# Patient Record
Sex: Male | Born: 1945
Health system: Southern US, Community
[De-identification: ages and names within clinical notes are randomized; demographics above are authoritative.]

## PROBLEM LIST (undated history)

## (undated) DIAGNOSIS — M1711 Unilateral primary osteoarthritis, right knee: Secondary | ICD-10-CM

## (undated) DIAGNOSIS — U071 COVID-19: Secondary | ICD-10-CM

## (undated) DIAGNOSIS — K5790 Diverticulosis of intestine, part unspecified, without perforation or abscess without bleeding: Secondary | ICD-10-CM

## (undated) DIAGNOSIS — K4090 Unilateral inguinal hernia, without obstruction or gangrene, not specified as recurrent: Secondary | ICD-10-CM

## (undated) DIAGNOSIS — J45909 Unspecified asthma, uncomplicated: Secondary | ICD-10-CM

## (undated) HISTORY — DX: Unilateral primary osteoarthritis, right knee: M17.11

## (undated) HISTORY — PX: INGUINAL HERNIA REPAIR: SUR1180

## (undated) HISTORY — DX: Unilateral inguinal hernia, without obstruction or gangrene, not specified as recurrent: K40.90

## (undated) HISTORY — DX: Diverticulosis of intestine, part unspecified, without perforation or abscess without bleeding: K57.90

## (undated) HISTORY — DX: Unspecified asthma, uncomplicated: J45.909

## (undated) HISTORY — DX: COVID-19: U07.1

---

## 2001-08-16 ENCOUNTER — Ambulatory Visit (HOSPITAL_COMMUNITY): Admission: RE | Admit: 2001-08-16 | Discharge: 2001-08-16 | Payer: Self-pay | Admitting: Emergency Medicine

## 2002-04-22 ENCOUNTER — Inpatient Hospital Stay (HOSPITAL_COMMUNITY): Admission: EM | Admit: 2002-04-22 | Discharge: 2002-04-23 | Payer: Self-pay | Admitting: Emergency Medicine

## 2002-04-25 ENCOUNTER — Encounter: Admission: RE | Admit: 2002-04-25 | Discharge: 2002-04-25 | Payer: Self-pay | Admitting: Family Medicine

## 2005-02-02 ENCOUNTER — Ambulatory Visit: Payer: Self-pay | Admitting: Gastroenterology

## 2005-02-15 ENCOUNTER — Ambulatory Visit: Payer: Self-pay | Admitting: Gastroenterology

## 2011-04-16 NOTE — Discharge Summary (Signed)
[place]. [facility]  Patient:    Stephen Patterson, Stephen Patterson Visit Number: [id number] MRN: [id number]          Service Type: MED Location: [phone] Attending Physician:  Stephen Patterson, Stephen Patterson. Dictated by:   Stephen Patterson, M.D. Admit Date:  04/22/2002 Discharge Date: 04/23/2002   CC:         Dr. Andee Patterson [facility]   Discharge Summary  DISCHARGE DIAGNOSES 1. Left lower extremity cellulitis with lymphangitis. 2. Tinea pedis. 3. Lower extremity edema.  DISCHARGE MEDICATIONS 1. Tequin 400 mg 1 tablet p.o. q.d. x 14 day. 2. Phenergan 12.5 mg 1 tablet p.o. q.8h. p.r.n. nausea. 3. Lotrimin over-the-counter antifungal apply to toes b.i.d. x 4 weeks.  PROCEDURES PERFORMED: Lower extremity Dopplers which were negative for DVT, superficial thrombus or Bakers cyst bilaterally.  HISTORY OF PRESENT ILLNESS: Please refer to the admission history and physical for a more detailed admission summary. Briefly the patient is a 65 year old white male without any significant past medical history who [facility] with left groin pain, queasy stomach pain which then progressed to a fever, ankle swelling and redness and heat his lower extremity. He does report mowing his lawn on Saturday and wearing very old, nasty smelly tennis shoes which became progressively wet while mowing the grass. The patient has had a history of cellulitis x 4 in the past almost every year for the past four years. Unclear whether there  has been  trauma to the left lower extremity or not. He does remotely remember doing some leg lifts and possibly injuring that lower extremity. [facility] and was given 1 gm of Rocephin IM and was found to be slightly hypotensive with a white blood cell count of 18 and febrile and was sent here for concern for sepsis, ability to take p.o. antibiotics and admission for close following.  LABORATORY STUDIES ON ADMISSION: White  count 17.9, hemoglobin 15.4, hematocrit 43.7, platelets 155. Blood cultures were also obtained. Sodium 136, potassium 3.8, chloride 100, bicarbonate 28, BUN 18, creatinine 1.4, glucose 124. Transaminases within normal limits.  HOSPITAL COURSE: The patient was admitted to the [facility] for further monitoring.  1. Left lower extremity cellulitis and lymphangitis. The patient was given Tequin 400 mg IV overnight as well as Phenergan IV p.r.n. nausea. He did well overnight. He has been ambulating on the day of discharge without problem. He was afebrile with stable vital signs and tolerated a full breakfast. He will complete a full 14 day course of Tequin for the cellulitis. We also discussed possibly revisiting a vascular surgeon to see if there are any additional studies that need to be obtained. We also discussed having available close at hand empiric treatment for possible future cellulitic events. Doppler studies were obtained which did not show any evidence of DVT or Bakers cyst.  2. Tinea pedis. The patient was found to have a small erythematous ulcerated area between his fourth and fifth toe on the left. I suspect this probably the source of entry. This should be covered well by p.o. antibiotics for any pseudamonal infection as well. He is to use an over-the-counter antifungal such as Lotrimin b.i.d. for the next four weeks and avoid moist, damp areas. He to keep his feet clean and dry.  3. Lower extremity edema. The patient does have a history of lower extremity edema more pronounced on the left. He was given a prescription to  purchase 30 mmHg surgical compression stockings in a medical supply company. He will also be following up with Dr. Darrick Patterson, who will be making custom orthotics for him secondary to his significantly cavus arches.  CONDITION ON DISCHARGE: Good.  DISPOSITION: Discharged to home with family, medications as per  above.  DISCHARGE INSTRUCTIONS: Elevating his feet when sitting. Keeping his feet clean and dry. Special instructions include purchasing the surgical compression stockings to help with venous return.  FOLLOW UP: The patient will follow up with Dr. Andee Patterson in approximately one week. He will also follow up with Dr. Darrick Patterson at [facility] on May 11, 2002, at 9:30 a.m. He is to bring his shoes in with him. He voiced good understanding of the above plan and had no further questions. Dictated by:   Stephen Patterson, M.D. Attending Physician:  Stephen Patterson, Stephen Cooler D. DD:  04/23/02 TD:  04/25/02 Job: [id number]

## 2012-01-12 ENCOUNTER — Encounter: Payer: Self-pay | Admitting: Gastroenterology

## 2012-02-21 ENCOUNTER — Encounter: Payer: Self-pay | Admitting: Gastroenterology

## 2012-08-04 ENCOUNTER — Telehealth: Payer: Self-pay | Admitting: Gastroenterology

## 2012-08-07 ENCOUNTER — Telehealth: Payer: Self-pay | Admitting: Gastroenterology

## 2012-08-07 ENCOUNTER — Ambulatory Visit: Payer: Self-pay | Admitting: Nurse Practitioner

## 2012-08-07 NOTE — Telephone Encounter (Signed)
See additional phone note. 

## 2012-08-07 NOTE — Telephone Encounter (Signed)
Pt states that his bowels are not moving like normal. States this has been going on for about 3-4 days. He is only going a little at a time. Reports taking miralax, eating prunes and raisins. He states he has a history of diverticulosis but has never had diverticulitis. He states he only has minor discomfort on the left side of his abdomen, no fever and abdomen not tender to touch. Offered pt an appt today at 2pm but pt states he is out of town. Pt requests an appt Wed. Pt scheduled to see Willette Cluster NP 08/09/12@9am . Pt aware of appt date and time.

## 2012-08-07 NOTE — Telephone Encounter (Signed)
Left message for pt to call back  °

## 2012-08-09 ENCOUNTER — Encounter: Payer: Self-pay | Admitting: Nurse Practitioner

## 2012-08-09 ENCOUNTER — Ambulatory Visit (INDEPENDENT_AMBULATORY_CARE_PROVIDER_SITE_OTHER): Payer: BC Managed Care – PPO | Admitting: Nurse Practitioner

## 2012-08-09 ENCOUNTER — Other Ambulatory Visit (INDEPENDENT_AMBULATORY_CARE_PROVIDER_SITE_OTHER): Payer: BC Managed Care – PPO

## 2012-08-09 VITALS — BP 132/70 | HR 72 | Ht 75.0 in | Wt 213.4 lb

## 2012-08-09 DIAGNOSIS — R1032 Left lower quadrant pain: Secondary | ICD-10-CM

## 2012-08-09 DIAGNOSIS — K5900 Constipation, unspecified: Secondary | ICD-10-CM

## 2012-08-09 LAB — CBC WITH DIFFERENTIAL/PLATELET
Basophils Relative: 1.2 % (ref 0.0–3.0)
Eosinophils Relative: 1.7 % (ref 0.0–5.0)
Lymphocytes Relative: 15.8 % (ref 12.0–46.0)
MCV: 88.8 fl (ref 78.0–100.0)
Monocytes Absolute: 0.4 10*3/uL (ref 0.1–1.0)
Monocytes Relative: 6.2 % (ref 3.0–12.0)
Neutrophils Relative %: 75.1 % (ref 43.0–77.0)
Platelets: 177 10*3/uL (ref 150.0–400.0)
RBC: 5.13 Mil/uL (ref 4.22–5.81)
WBC: 5.7 10*3/uL (ref 4.5–10.5)

## 2012-08-09 NOTE — Progress Notes (Signed)
Reviewed, unable to demonstrate  Left inguinal hernia  While pt standing, but the symptoms a suggestive of an early left inguinal ring weakness with bowl trying to prolapse. Pt advised to observe and report any changes.

## 2012-08-09 NOTE — Progress Notes (Signed)
08/09/2012 Stephen Patterson [id number] 17-Aug-1946   HISTORY OF PRESENT ILLNESS: Patient is a 66 year old male with no signifiant PMH.  He gets screening colonoscopies with Dr Arlyce Dice. Last colonoscopy March 2006 .diverticulosis was found, no other findings. Do to: Cancer screening guidelines, surveillance colonoscopy was moved to 2016. Patient here for evaluation of new onset constipation. Bowel movements have always been "regular" but 2 months ago patient had an episode of constipation which began with a decreased urge to defecate followed by a small amount of stool. Patient took   miralax and constipation resolved. He had a recurrent episode of constipation a few days ago, this time associated with mild bloating and mild LLQ discomfort. In addition, he describes a small old in left lower quadrant. He restarted MiraLax and bowel movements are normal again and the LLQ bulge is gone though he does still have some very mild left lower quadrant discomfort. On a rare occasion he sees a very scant amount of blood when wiping.   Past Medical History  Diagnosis Date  . Diverticulosis   . Asthma    History reviewed. No pertinent past surgical history.  reports that he has never smoked. He has never used smokeless tobacco. He reports that he drinks alcohol. He reports that he does not use illicit drugs. family history includes Colon cancer (age of onset:56) in an unspecified family member. Allergies  Allergen Reactions  . Penicillins     As a child unsure of reaction      Outpatient Encounter Prescriptions as of 08/09/2012  Medication Sig Dispense Refill  . albuterol (PROVENTIL HFA;VENTOLIN HFA) 108 (90 BASE) MCG/ACT inhaler Inhale 2 puffs into the lungs as needed.          REVIEW OF SYSTEMS  : All other systems reviewed and negative except where noted in the History of Present Illness.   PHYSICAL EXAM: BP 132/70  Pulse 72  Ht 6\' 3"  (1.905 m)  Wt 213 lb 6.4 oz (96.798 kg)  BMI 26.67  kg/m2 General: Well developed white male in no acute distress Head: Normocephalic and atraumatic Eyes:  sclerae anicteric,conjunctive pink. Ears: Normal auditory acuity Neck: Supple, no masses.  Lungs: Clear throughout to auscultation Heart: Regular rate and rhythm Abdomen: Soft, non distended, nontender. No masses or hepatomegaly noted. Normal Bowel sounds. No appreciable LLQ or left inguinal "bulge". No left inguinal hernia felt by Dr. Juanda Chance today. Musculoskeletal: Symmetrical with no gross deformities  Skin: No lesions on visible extremities Extremities: No edema or deformities noted Neurological: Alert oriented x 4, grossly nonfocal Cervical Nodes:  No significant cervical adenopathy Psychological:  Alert and cooperative. Normal mood and affect  ASSESSMENT AND PLAN:  1. constipation with mild bloating and mild LLQ discomfort. Unclear why the recent episodes of constipation. His mild bloating and LLQ discomfort could be from constipation. With MiraLax his bowels are moving well but he still has some mild left lower quadrant discomfort. Doubt diverticulitis but will check CBC today. We discussed options. Since his bowel movements are back to normal we agreed to give this a a few more days. If left lower quadrant discomfort persist despite normal bowel movements then will treat empirically for what could be very mild diverticulitis. If he has recurrent constipation, will obtain some basic labs and possibly consider doing his colonoscopy earlier given bowel habit changes. Our office will call patient for condition update and discussion of CBC results in a few days.  2. Questionable early left inguinal hernia. No hernia on  exam today(by Dr. Juanda Chance) but patient describes a small left inguinal bulge when he was constipated a few days ago. Perhaps he has the beginning of a left inguinal hernia.

## 2012-08-09 NOTE — Patient Instructions (Addendum)
Please go to the basement level to have your labs drawn.  Call us in a few days with a progress report.

## 2012-10-02 ENCOUNTER — Telehealth: Payer: Self-pay | Admitting: Gastroenterology

## 2012-10-02 NOTE — Telephone Encounter (Signed)
Pt states he is still having problems with constipation. Also states he thinks he has an inguinal hernia. States "something popped out" Saturday when he was playing golf and he was able to push it back in. Let pt know it is ok for him to take miralax daily if he needs to for the constipation but for the hernia he needs to see a surgeon. Pt given the phone number for [facility]. Pt wants to keep the appt with Dr. Arlyce Dice for constipation.

## 2012-10-04 ENCOUNTER — Ambulatory Visit (INDEPENDENT_AMBULATORY_CARE_PROVIDER_SITE_OTHER): Payer: BC Managed Care – PPO | Admitting: General Surgery

## 2012-10-06 ENCOUNTER — Ambulatory Visit (INDEPENDENT_AMBULATORY_CARE_PROVIDER_SITE_OTHER): Payer: BC Managed Care – PPO | Admitting: General Surgery

## 2012-10-06 ENCOUNTER — Encounter (INDEPENDENT_AMBULATORY_CARE_PROVIDER_SITE_OTHER): Payer: Self-pay | Admitting: General Surgery

## 2012-10-06 VITALS — BP 130/76 | HR 74 | Temp 97.8°F | Resp 16 | Ht 76.0 in | Wt 215.0 lb

## 2012-10-06 DIAGNOSIS — K4090 Unilateral inguinal hernia, without obstruction or gangrene, not specified as recurrent: Secondary | ICD-10-CM

## 2012-10-06 NOTE — Progress Notes (Signed)
Patient ID: Stephen Patterson, male   DOB: 05/26/1946, 66 y.o.   MRN: [id number]  Chief Complaint  Patient presents with  . Inguinal Hernia    HPI Stephen Patterson is a 66 y.o. male.  His self-referred for a left inguinal hernia. Patient began having noticeable pain burning sensations approximately 2 months ago. He noticed while playing golf of the hernia would bulge out and he would have to reduce it.  The patient has had no signs of incarceration, but has had some constipation for which takes MiraLax at this time. HPI  Past Medical History  Diagnosis Date  . Diverticulosis   . Asthma   . Inguinal hernia     History reviewed. No pertinent past surgical history.  Family History  Problem Relation Age of Onset  . Colon cancer  45    niece  . Cancer Mother     breast    Social History History  Substance Use Topics  . Smoking status: Never Smoker   . Smokeless tobacco: Never Used  . Alcohol Use: Yes     Comment: 2 per week    Allergies  Allergen Reactions  . Penicillins     As a child unsure of reaction    Current Outpatient Prescriptions  Medication Sig Dispense Refill  . albuterol (PROVENTIL HFA;VENTOLIN HFA) 108 (90 BASE) MCG/ACT inhaler Inhale 2 puffs into the lungs as needed.         Review of Systems Review of Systems  Constitutional: Negative.   HENT: Negative.   Eyes: Negative.   Respiratory: Negative.   Cardiovascular: Negative.   Gastrointestinal: Negative.   Musculoskeletal: Negative.     Blood pressure 130/76, pulse 74, temperature 97.8 F (36.6 C), temperature source Temporal, resp. rate 16, height 6\' 4"  (1.93 m), weight 215 lb (97.523 kg).  Physical Exam Physical Exam  Constitutional: He is oriented to person, place, and time. He appears well-developed and well-nourished.  HENT:  Head: Normocephalic and atraumatic.  Eyes: Pupils are equal, round, and reactive to light.  Neck: Normal range of motion. Neck supple.  Cardiovascular: Normal rate and  regular rhythm.   Pulmonary/Chest: Effort normal and breath sounds normal.  Abdominal: Soft. Bowel sounds are normal. A hernia is present. Hernia confirmed positive in the left inguinal area. Hernia confirmed negative in the right inguinal area.       Less likely direct inguinal hernia  Musculoskeletal: Normal range of motion.  Neurological: He is alert and oriented to person, place, and time.    Data Reviewed none  Assessment    66 year old male with a left inguinal hernia, likely direct.    Plan    1. We'll proceed to the operating room for left laparoscopic inguinal hernia repair Mesh.  2. All risks and benefits were discussed with the patient, to generally include infection, bleeding, damage to surrounding structures, and recurrence. Alternatives were offered and described.  All questions were answered and the patient voiced understanding of the procedure and wishes to proceed at this point.        Marigene Ehlers., Kayci Belleville 10/06/2012, 2:07 PM

## 2012-10-12 ENCOUNTER — Ambulatory Visit: Payer: BC Managed Care – PPO | Admitting: Gastroenterology

## 2012-10-13 ENCOUNTER — Ambulatory Visit (INDEPENDENT_AMBULATORY_CARE_PROVIDER_SITE_OTHER): Payer: BC Managed Care – PPO | Admitting: General Surgery

## 2012-10-24 ENCOUNTER — Encounter (INDEPENDENT_AMBULATORY_CARE_PROVIDER_SITE_OTHER): Payer: BC Managed Care – PPO | Admitting: General Surgery

## 2012-12-04 ENCOUNTER — Telehealth: Payer: Self-pay | Admitting: Gastroenterology

## 2012-12-04 NOTE — Telephone Encounter (Signed)
Pt states he is having problems with LLQ abdominal pain. Reports he also had some "ribbon like" stool this morning. Pt concerned. Pt scheduled to see Dr. Arlyce Dice 12/07/12@9 :30am. Pt aware of appt date and time.

## 2012-12-07 ENCOUNTER — Ambulatory Visit: Payer: BC Managed Care – PPO | Admitting: Gastroenterology

## 2012-12-20 ENCOUNTER — Encounter: Payer: Self-pay | Admitting: Gastroenterology

## 2012-12-20 ENCOUNTER — Ambulatory Visit (INDEPENDENT_AMBULATORY_CARE_PROVIDER_SITE_OTHER): Payer: BC Managed Care – PPO | Admitting: Gastroenterology

## 2012-12-20 VITALS — BP 120/78 | HR 76 | Ht 76.0 in | Wt 216.0 lb

## 2012-12-20 DIAGNOSIS — R1032 Left lower quadrant pain: Secondary | ICD-10-CM

## 2012-12-20 DIAGNOSIS — K5900 Constipation, unspecified: Secondary | ICD-10-CM

## 2012-12-20 NOTE — Patient Instructions (Addendum)
You have been scheduled for a colonoscopy with propofol. Please follow written instructions given to you at your visit today.  Please pick up your prep kit at the pharmacy within the next 1-3 days. If you use inhalers (even only as needed) or a CPAP machine, please bring them with you on the day of your procedure.  Suprep has been sent to your pharmacy

## 2012-12-20 NOTE — Assessment & Plan Note (Signed)
Symptomatic left inguinal hernia. Plans per surgery

## 2012-12-20 NOTE — Assessment & Plan Note (Signed)
Patient has had a mild change in bowel habits. Last colonoscopy was 8 years ago. I think is reasonable to do a colonoscopy at this time, particularly since he is anticipating a herniorrhaphy.

## 2012-12-20 NOTE — Progress Notes (Signed)
History of Present Illness:  The patient has returned for followup of constipation and hernia. Constipation has responded fairly well to MiraLax. He remains concerned about colon cancer because his 68 year old niece recently was diagnosed with colon cancer. Last colonoscopy was 2006. He has a left inguinal hernia and is symptomatic from this. He anticipates getting this repaired. On occasion he's had minimal amounts of bleeding when he moves his bowels.    Review of Systems: Pertinent positive and negative review of systems were noted in the above HPI section. All other review of systems were otherwise negative.    Current Medications, Allergies, Past Medical History, Past Surgical History, Family History and Social History were reviewed in [employer] electronic medical record  Vital signs were reviewed in today's medical record. Physical Exam: General: Well developed , well nourished, no acute distress

## 2013-01-09 ENCOUNTER — Ambulatory Visit (AMBULATORY_SURGERY_CENTER): Payer: BC Managed Care – PPO | Admitting: Gastroenterology

## 2013-01-09 ENCOUNTER — Encounter: Payer: Self-pay | Admitting: Gastroenterology

## 2013-01-09 VITALS — BP 151/93 | HR 58 | Temp 97.0°F | Resp 29 | Ht 76.0 in | Wt 216.0 lb

## 2013-01-09 DIAGNOSIS — D126 Benign neoplasm of colon, unspecified: Secondary | ICD-10-CM

## 2013-01-09 DIAGNOSIS — Z1211 Encounter for screening for malignant neoplasm of colon: Secondary | ICD-10-CM

## 2013-01-09 DIAGNOSIS — K5900 Constipation, unspecified: Secondary | ICD-10-CM

## 2013-01-09 DIAGNOSIS — R1032 Left lower quadrant pain: Secondary | ICD-10-CM

## 2013-01-09 MED ORDER — SODIUM CHLORIDE 0.9 % IV SOLN: 500.0000 mL | INTRAVENOUS | Status: DC

## 2013-01-09 NOTE — Op Note (Addendum)
[facility] [address] [place], [address]   COLONOSCOPY PROCEDURE REPORT  PATIENT: Stephen Patterson, Stephen Patterson  MR#: [id number] BIRTHDATE: Dec 22, 1945 , 66  yrs. old GENDER: Male ENDOSCOPIST: Louis Meckel, MD REFERRED AO:[id number] Vear Clock, M.D. PROCEDURE DATE:  01/09/2013 PROCEDURE:   Colonoscopy with cold biopsy polypectomy ASA CLASS:   Class II INDICATIONS: Colorectal cancer screening. MEDICATIONS: MAC sedation, administered by CRNA and propofol (Diprivan) 250mg  IV  DESCRIPTION OF PROCEDURE:   After the risks benefits and alternatives of the procedure were thoroughly explained, informed consent was obtained.  A digital rectal exam revealed no abnormalities of the rectum.   The LB [id number]  endoscope was introduced through the anus and advanced to the cecum, which was identified by both the appendix and ileocecal valve. No adverse events experienced.   The quality of the prep was Suprep excellent The instrument was then slowly withdrawn as the colon was fully examined.      COLON FINDINGS: A sessile polyp measuring 1-2 mm in size was found at the cecum.  A polypectomy was performed with cold forceps.  The resection was complete and the polyp tissue was completely retrieved.   The colon mucosa was otherwise normal.  Retroflexed views revealed no abnormalities. The time to cecum=3 minutes 18 seconds.  Withdrawal time=6 minutes 05 seconds.  The scope was withdrawn and the procedure completed. COMPLICATIONS: There were no complications.  ENDOSCOPIC IMPRESSION: 1.   Sessile polyp measuring 1-2 mm in size was found at the cecum; polypectomy was performed with cold forceps 2.   The colon mucosa was otherwise normal  RECOMMENDATIONS: If the polyp(s) removed today are proven to be adenomatous (pre-cancerous) polyps, you will need a repeat colonoscopy in 5 years.  Otherwise you should continue to follow colorectal cancer screening guidelines for "routine  risk" patients with colonoscopy in 10 years.  You will receive a letter within 1-2 weeks with the results of your biopsy as well as final recommendations.  Please call my office if you have not received a letter after 3 weeks. Fiber supplementation for constipation   eSigned:  Louis Meckel, MD 08/20/2013 11:23 AM Revised: 08/20/2013 11:23 AM  cc:

## 2013-01-09 NOTE — Progress Notes (Signed)
Patient did not experience any of the following events: a burn prior to discharge; a fall within the facility; wrong site/side/patient/procedure/implant event; or a hospital transfer or hospital admission upon discharge from the facility. (G8907) Patient did not have preoperative order for IV antibiotic SSI prophylaxis. (G8918)  

## 2013-01-09 NOTE — Progress Notes (Signed)
Called to room to assist during endoscopic procedure.  Patient ID and intended procedure confirmed with present staff. Received instructions for my participation in the procedure from the performing physician.  

## 2013-01-09 NOTE — Patient Instructions (Addendum)
Discharge instructions given with verbal understanding. Handout on polyps given. Resume previous medications. YOU HAD AN ENDOSCOPIC PROCEDURE TODAY AT THE [facility]: Refer to the procedure report that was given to you for any specific questions about what was found during the examination.  If the procedure report does not answer your questions, please call your gastroenterologist to clarify.  If you requested that your care partner not be given the details of your procedure findings, then the procedure report has been included in a sealed envelope for you to review at your convenience later.  YOU SHOULD EXPECT: Some feelings of bloating in the abdomen. Passage of more gas than usual.  Walking can help get rid of the air that was put into your GI tract during the procedure and reduce the bloating. If you had a lower endoscopy (such as a colonoscopy or flexible sigmoidoscopy) you may notice spotting of blood in your stool or on the toilet paper. If you underwent a bowel prep for your procedure, then you may not have a normal bowel movement for a few days.  DIET: Your first meal following the procedure should be a light meal and then it is ok to progress to your normal diet.  A half-sandwich or bowl of soup is an example of a good first meal.  Heavy or fried foods are harder to digest and may make you feel nauseous or bloated.  Likewise meals heavy in dairy and vegetables can cause extra gas to form and this can also increase the bloating.  Drink plenty of fluids but you should avoid alcoholic beverages for 24 hours.  ACTIVITY: Your care partner should take you home directly after the procedure.  You should plan to take it easy, moving slowly for the rest of the day.  You can resume normal activity the day after the procedure however you should NOT DRIVE or use heavy machinery for 24 hours (because of the sedation medicines used during the test).    SYMPTOMS TO REPORT IMMEDIATELY: A  gastroenterologist can be reached at any hour.  During normal business hours, 8:30 AM to 5:00 PM Monday through Friday, call [phone].  After hours and on weekends, please call the GI answering service at [phone] who will take a message and have the physician on call contact you.   Following lower endoscopy (colonoscopy or flexible sigmoidoscopy):  Excessive amounts of blood in the stool  Significant tenderness or worsening of abdominal pains  Swelling of the abdomen that is new, acute  Fever of 100F or higher  FOLLOW UP: If any biopsies were taken you will be contacted by phone or by letter within the next 1-3 weeks.  Call your gastroenterologist if you have not heard about the biopsies in 3 weeks.  Our staff will call the home number listed on your records the next business day following your procedure to check on you and address any questions or concerns that you may have at that time regarding the information given to you following your procedure. This is a courtesy call and so if there is no answer at the home number and we have not heard from you through the emergency physician on call, we will assume that you have returned to your regular daily activities without incident.  SIGNATURES/CONFIDENTIALITY: You and/or your care partner have signed paperwork which will be entered into your electronic medical record.  These signatures attest to the fact that that the information above on your After Visit Summary has   been reviewed and is understood.  Full responsibility of the confidentiality of this discharge information lies with you and/or your care-partner. 

## 2013-01-10 ENCOUNTER — Telehealth: Payer: Self-pay | Admitting: *Deleted

## 2013-01-10 NOTE — Telephone Encounter (Signed)
  Follow up Call-  Call back number 01/09/2013  Post procedure Call Back phone  # [phone]  Permission to leave phone message Yes     Patient questions:  Do you have a fever, pain , or abdominal swelling? no Pain Score  0 *  Have you tolerated food without any problems? yes  Have you been able to return to your normal activities? yes  Do you have any questions about your discharge instructions: Diet   no Medications  no Follow up visit  no  Do you have questions or concerns about your Care? no  Actions: * If pain score is 4 or above: No action needed, pain <4.

## 2013-01-17 ENCOUNTER — Encounter: Payer: Self-pay | Admitting: Gastroenterology

## 2013-12-11 ENCOUNTER — Ambulatory Visit (INDEPENDENT_AMBULATORY_CARE_PROVIDER_SITE_OTHER): Payer: BC Managed Care – PPO | Admitting: General Surgery

## 2013-12-11 ENCOUNTER — Encounter (INDEPENDENT_AMBULATORY_CARE_PROVIDER_SITE_OTHER): Payer: Self-pay | Admitting: General Surgery

## 2013-12-11 VITALS — BP 142/82 | HR 72 | Resp 16 | Ht 76.0 in | Wt 212.6 lb

## 2013-12-11 DIAGNOSIS — K4090 Unilateral inguinal hernia, without obstruction or gangrene, not specified as recurrent: Secondary | ICD-10-CM

## 2013-12-11 NOTE — Progress Notes (Signed)
Patient ID: Stephen Patterson, male   DOB: 1946-01-13, 68 y.o.   MRN: [id number] Patient presents with   .  Inguinal Hernia   HPI  Stephen Patterson is a 68 y.o. male. His self-referred for a left inguinal hernia. Patient began having noticeable pain burning sensations approximately several months ago. He noticed while playing golf of the hernia would bulge out and he would have to reduce it. The patient has had no signs of incarceration, but has had some constipation for which takes MiraLax at this time.  HPI  Past Medical History   Diagnosis  Date   .  Diverticulosis    .  Asthma    .  Inguinal hernia    History reviewed. No pertinent past surgical history.  Family History   Problem  Relation  Age of Onset   .  Colon cancer   105      niece    .  Cancer  Mother       breast   Social History  History   Substance Use Topics   .  Smoking status:  Never Smoker   .  Smokeless tobacco:  Never Used   .  Alcohol Use:  Yes      Comment: 2 per week    Allergies   Allergen  Reactions   .  Penicillins      As a child unsure of reaction    Current Outpatient Prescriptions   Medication  Sig  Dispense  Refill   .  albuterol (PROVENTIL HFA;VENTOLIN HFA) 108 (90 BASE) MCG/ACT inhaler  Inhale 2 puffs into the lungs as needed.     Review of Systems  Review of Systems  Constitutional: Negative.  HENT: Negative.  Eyes: Negative.  Respiratory: Negative.  Cardiovascular: Negative.  Gastrointestinal: Negative.  Musculoskeletal: Negative.  Blood pressure 130/76, pulse 74, temperature 97.8 F (36.6 C), temperature source Temporal, resp. rate 16, height 6\' 4"  (1.93 m), weight 215 lb (97.523 kg).  Physical Exam  Physical Exam  Constitutional: He is oriented to person, place, and time. He appears well-developed and well-nourished.  HENT:  Head: Normocephalic and atraumatic.  Eyes: Pupils are equal, round, and reactive to light.  Neck: Normal range of motion. Neck supple.  Cardiovascular: Normal  rate and regular rhythm.  Pulmonary/Chest: Effort normal and breath sounds normal.  Abdominal: Soft. Bowel sounds are normal. A hernia is present. Hernia confirmed positive in the left inguinal area. Hernia confirmed negative in the right inguinal area.  Less likely direct inguinal hernia  Musculoskeletal: Normal range of motion.  Neurological: He is alert and oriented to person, place, and time.  Data Reviewed  none  Assessment  68 year old male with a left inguinal hernia, likely direct.  Plan  1. We'll proceed to the operating room for left laparoscopic inguinal hernia repair Mesh.  2. All risks and benefits were discussed with the patient, to generally include infection, bleeding, damage to surrounding structures, and recurrence. Alternatives were offered and described. All questions were answered and the patient voiced understanding of the procedure and wishes to proceed at this point.

## 2013-12-14 ENCOUNTER — Ambulatory Visit (INDEPENDENT_AMBULATORY_CARE_PROVIDER_SITE_OTHER): Payer: BC Managed Care – PPO | Admitting: General Surgery

## 2013-12-18 DIAGNOSIS — K4090 Unilateral inguinal hernia, without obstruction or gangrene, not specified as recurrent: Secondary | ICD-10-CM

## 2014-01-03 ENCOUNTER — Ambulatory Visit (INDEPENDENT_AMBULATORY_CARE_PROVIDER_SITE_OTHER): Payer: BC Managed Care – PPO | Admitting: General Surgery

## 2014-01-03 ENCOUNTER — Encounter (INDEPENDENT_AMBULATORY_CARE_PROVIDER_SITE_OTHER): Payer: Self-pay | Admitting: General Surgery

## 2014-01-03 VITALS — BP 138/82 | HR 74 | Resp 16 | Ht 76.0 in | Wt 209.0 lb

## 2014-01-03 DIAGNOSIS — Z9889 Other specified postprocedural states: Secondary | ICD-10-CM

## 2014-01-03 NOTE — Progress Notes (Signed)
Patient ID: Stephen Patterson, male   DOB: [birth year], 68 y.o.   MRN: [id number] Post op course The patient has been doing well postoperatively. He's had minimal pain taking no pain medication. The patient is slowly getting back to the gym and on the treadmill. He is not do any heavy lifting.  On Exam: His wounds are clean dry and intact, there is no hernia on palpation   Assessment and Plan 68 year old male status post laparoscopic left inguinal hernia repair with mesh 1. We discussed weightlifting restrictions for another 2 weeks. 2. Patient follow up as needed   Ralene Ok, MD [facility] General & Minimally Invasive Surgery Trauma & Emergency Surgery

## 2014-03-26 ENCOUNTER — Ambulatory Visit: Payer: BC Managed Care – PPO | Admitting: Family Medicine

## 2014-03-26 VITALS — BP 138/82 | HR 62 | Temp 97.5°F | Resp 16 | Ht 62.5 in | Wt 209.6 lb

## 2014-03-26 DIAGNOSIS — H1013 Acute atopic conjunctivitis, bilateral: Secondary | ICD-10-CM

## 2014-03-26 DIAGNOSIS — R590 Localized enlarged lymph nodes: Secondary | ICD-10-CM

## 2014-03-26 DIAGNOSIS — H1045 Other chronic allergic conjunctivitis: Secondary | ICD-10-CM

## 2014-03-26 DIAGNOSIS — R599 Enlarged lymph nodes, unspecified: Secondary | ICD-10-CM

## 2014-03-26 DIAGNOSIS — J069 Acute upper respiratory infection, unspecified: Secondary | ICD-10-CM

## 2014-03-26 MED ORDER — CETIRIZINE HCL 10 MG PO TABS: 10.0000 mg | ORAL_TABLET | Freq: Every day | ORAL | Status: DC

## 2014-03-26 MED ORDER — ALBUTEROL SULFATE HFA 108 (90 BASE) MCG/ACT IN AERS: 2.0000 | INHALATION_SPRAY | RESPIRATORY_TRACT | Status: DC | PRN

## 2014-03-26 MED ORDER — CEFDINIR 300 MG PO CAPS: 600.0000 mg | ORAL_CAPSULE | Freq: Every day | ORAL | Status: DC

## 2014-03-26 NOTE — Progress Notes (Signed)
This chart was scribed for Laurey Arrow. Brigitte Pulse, MD by Marcha Dutton, ED Scribe. This patient was seen in room 10 and the patient's care was started at 10:54 AM.  Subjective:    Patient ID: Stephen Patterson, male    DOB: 1946-07-21, 68 y.o.   MRN: [id number]  Chief Complaint  Patient presents with  . URI    little wheezing;Cough-yellow;chest congestion;eyes-red, matted x 1 wk    HPI HPI Comments: Stephen Patterson is a 68 y.o. male who presents to the [facility] complaining of congestion that began a week or so ago. He also reports eye discharge, cough productive of green and yellow sputum that has woken him from sleep, and swollen and tender right ant upper cervical lymph. He also reports he has some shortness of breath due to mild asthma for which he uses an albuterol inhaler prn - uses rarely but more over past few d. Pt states his wife was seen here by Dr. Everlene Farrier for similar for URI symptoms 10d prior. He reports she ended up having pneumonia seen on CXR and CT, for which she was prescribed omnicef. His wife does smoke but outside only. He's concerned he may have caught her pneumonia. He states that sev days after she started the antibiotic, he developed laryngitis and pharyngitis which then evolved into his current symptoms. Pt reports using Mucinex with mild relief of symptoms. He denies sinus pressure and pain, upper back pain, chest congestion, fever, and chills.   Pt reports penicillin allergy was diagnosed as a child and states he is unsure if allergy persists.    Past Medical History  Diagnosis Date  . Diverticulosis   . Asthma   . Inguinal hernia     Current Outpatient Prescriptions on File Prior to Visit  Medication Sig Dispense Refill  . albuterol (PROVENTIL HFA;VENTOLIN HFA) 108 (90 BASE) MCG/ACT inhaler Inhale 2 puffs into the lungs as needed.        No current facility-administered medications on file prior to visit.    Allergies  Allergen Reactions  .  Penicillins     As a child unsure of reaction    Review of Systems  Constitutional: Negative for fever, chills, diaphoresis, activity change and appetite change.  HENT: Positive for congestion and postnasal drip. Negative for ear discharge, ear pain, hearing loss, nosebleeds, rhinorrhea, sinus pressure and sore throat.   Eyes: Positive for discharge and redness. Negative for photophobia, pain, itching and visual disturbance.  Respiratory: Positive for cough and shortness of breath. Negative for apnea, chest tightness and wheezing.   Gastrointestinal: Negative for nausea, vomiting, abdominal pain, diarrhea and abdominal distention.  Genitourinary: Negative for dysuria and frequency.  Musculoskeletal: Positive for back pain and myalgias. Negative for arthralgias, joint swelling, neck pain and neck stiffness.  Skin: Negative for rash.  Allergic/Immunologic: Positive for environmental allergies.  Neurological: Negative for dizziness and light-headedness.  Hematological: Positive for adenopathy.  Psychiatric/Behavioral: The patient is not nervous/anxious.        Objective:   Physical Exam  Nursing note and vitals reviewed. Constitutional: He is oriented to person, place, and time. He appears well-developed and well-nourished. No distress.  HENT:  Head: Normocephalic and atraumatic.  Right Ear: Tympanic membrane is injected.  Left Ear: Tympanic membrane is injected.  Nose: Mucosal edema present.  Neck: Normal range of motion. Neck supple. No thyromegaly present.  Cardiovascular: Normal rate, regular rhythm and normal heart sounds.  Exam reveals no gallop and  no friction rub.   No murmur heard. Pulmonary/Chest: Effort normal and breath sounds normal. No respiratory distress. He has no wheezes. He has no rales.  Lymphadenopathy:    He has cervical adenopathy (mild anterior ).  Neurological: He is alert and oriented to person, place, and time. No cranial nerve deficit. Coordination normal.    Skin: Skin is warm and dry. He is not diaphoretic.  Psychiatric: He has a normal mood and affect. His behavior is normal.    Triage Vitals: BP 138/82  Pulse 62  Temp(Src) 97.5 F (36.4 C) (Oral)  Resp 16  Ht 5' 2.5" (1.588 m)  Wt 209 lb 9.6 oz (95.074 kg)  BMI 37.70 kg/m2  SpO2 95%     Assessment & Plan:  DIAGNOSTIC STUDIES: Oxygen Saturation is 95% on RA, adequate by my interpretation.    COORDINATION OF CARE: Pt advised of plan for treatment and pt agrees. Discussed antibiotic use in light of wife's bacterial pneumonia diagnosis and pt's deep productive cough and LAD w/ h/o asthma.  If no improvement or worsening, RTC for CXR and cbc. Continue usage of mucinex to treat symptoms. If eye symptoms worsen, would recommend antihistamine drops such as Pataday. Alb refilled for int mild asthma. R/B of abx therapy reviewed. URI, acute  Allergic conjunctivitis of both eyes  Lymphadenopathy of right cervical region  Meds ordered this encounter  Medications  . cefdinir (OMNICEF) 300 MG capsule    Sig: Take 2 capsules (600 mg total) by mouth daily.    Dispense:  20 capsule    Refill:  0  . cetirizine (ZYRTEC) 10 MG tablet    Sig: Take 1 tablet (10 mg total) by mouth daily.    Dispense:  30 tablet    Refill:  1  . albuterol (PROVENTIL HFA;VENTOLIN HFA) 108 (90 BASE) MCG/ACT inhaler    Sig: Inhale 2 puffs into the lungs every 4 (four) hours as needed for wheezing or shortness of breath.    Dispense:  3.7 g    Refill:  3    I personally performed the services described in this documentation, which was scribed in my presence. The recorded information has been reviewed and considered, and addended by me as needed.  Delman Cheadle, MD MPH

## 2014-03-26 NOTE — Patient Instructions (Signed)
Hay Fever Hay fever is an allergic reaction to particles in the air. It cannot be passed from person to person. It cannot be cured, but it can be controlled. CAUSES  Hay fever is caused by something that triggers an allergic reaction (allergens). The following are examples of allergens:  Ragweed.  Feathers.  Animal dander.  Grass and tree pollens.  Cigarette smoke.  House dust.  Pollution. SYMPTOMS   Sneezing.  Runny or stuffy nose.  Tearing eyes.  Itchy eyes, nose, mouth, throat, skin, or other area.  Sore throat.  Headache.  Decreased sense of smell or taste. DIAGNOSIS Your caregiver will perform a physical exam and ask questions about the symptoms you are having.Allergy testing may be done to determine exactly what triggers your hay fever.  TREATMENT   Over-the-counter medicines may help symptoms. These include:  Antihistamines.  Decongestants. These may help with nasal congestion.  Your caregiver may prescribe medicines if over-the-counter medicines do not work.  Some people benefit from allergy shots when other medicines are not helpful. HOME CARE INSTRUCTIONS   Avoid the allergen that is causing your symptoms, if possible.  Take all medicine as told by your caregiver. SEEK MEDICAL CARE IF:   You have severe allergy symptoms and your current medicines are not helping.  Your treatment was working at one time, but you are now experiencing symptoms.  You have sinus congestion and pressure.  You develop a fever or headache.  You have thick nasal discharge.  You have asthma and have a worsening cough and wheezing. SEEK IMMEDIATE MEDICAL CARE IF:   You have swelling of your tongue or lips.  You have trouble breathing.  You feel lightheaded or like you are going to faint.  You have cold sweats.  You have a fever. Document Released: 11/15/2005 Document Revised: 02/07/2012 Document Reviewed: 02/10/2011 [facility] Patient Information 2014  [place]. Pneumonia, Adult Pneumonia is an infection of the lungs.  CAUSES Pneumonia may be caused by bacteria or a virus. Usually, these infections are caused by breathing infectious particles into the lungs (respiratory tract). SYMPTOMS   Cough.  Fever.  Chest pain.  Increased rate of breathing.  Wheezing.  Mucus production. DIAGNOSIS  If you have the common symptoms of pneumonia, your caregiver will typically confirm the diagnosis with a chest X-ray. The X-ray will show an abnormality in the lung (pulmonary infiltrate) if you have pneumonia. Other tests of your blood, urine, or sputum may be done to find the specific cause of your pneumonia. Your caregiver may also do tests (blood gases or pulse oximetry) to see how well your lungs are working. TREATMENT  Some forms of pneumonia may be spread to other people when you cough or sneeze. You may be asked to wear a mask before and during your exam. Pneumonia that is caused by bacteria is treated with antibiotic medicine. Pneumonia that is caused by the influenza virus may be treated with an antiviral medicine. Most other viral infections must run their course. These infections will not respond to antibiotics.  PREVENTION A pneumococcal shot (vaccine) is available to prevent a common bacterial cause of pneumonia. This is usually suggested for:  People over 11 years old.  Patients on chemotherapy.  People with chronic lung problems, such as bronchitis or emphysema.  People with immune system problems. If you are over 65 or have a high risk condition, you may receive the pneumococcal vaccine if you have not received it before. In some countries, a routine influenza vaccine  is also recommended. This vaccine can help prevent some cases of pneumonia.You may be offered the influenza vaccine as part of your care. If you smoke, it is time to quit. You may receive instructions on how to stop smoking. Your caregiver can provide medicines  and counseling to help you quit. HOME CARE INSTRUCTIONS   Cough suppressants may be used if you are losing too much rest. However, coughing protects you by clearing your lungs. You should avoid using cough suppressants if you can.  Your caregiver may have prescribed medicine if he or she thinks your pneumonia is caused by a bacteria or influenza. Finish your medicine even if you start to feel better.  Your caregiver may also prescribe an expectorant. This loosens the mucus to be coughed up.  Only take over-the-counter or prescription medicines for pain, discomfort, or fever as directed by your caregiver.  Do not smoke. Smoking is a common cause of bronchitis and can contribute to pneumonia. If you are a smoker and continue to smoke, your cough may last several weeks after your pneumonia has cleared.  A cold steam vaporizer or humidifier in your room or home may help loosen mucus.  Coughing is often worse at night. Sleeping in a semi-upright position in a recliner or using a couple pillows under your head will help with this.  Get rest as you feel it is needed. Your body will usually let you know when you need to rest. SEEK IMMEDIATE MEDICAL CARE IF:   Your illness becomes worse. This is especially true if you are elderly or weakened from any other disease.  You cannot control your cough with suppressants and are losing sleep.  You begin coughing up blood.  You develop pain which is getting worse or is uncontrolled with medicines.  You have a fever.  Any of the symptoms which initially brought you in for treatment are getting worse rather than better.  You develop shortness of breath or chest pain. MAKE SURE YOU:   Understand these instructions.  Will watch your condition.  Will get help right away if you are not doing well or get worse. Document Released: 11/15/2005 Document Revised: 02/07/2012 Document Reviewed: 02/04/2011 [facility] Patient Information 2014 [place],  [place].

## 2014-05-28 ENCOUNTER — Other Ambulatory Visit: Payer: Self-pay

## 2014-07-08 ENCOUNTER — Other Ambulatory Visit: Payer: Self-pay | Admitting: Family Medicine

## 2014-09-16 ENCOUNTER — Other Ambulatory Visit: Payer: Self-pay | Admitting: Family Medicine

## 2015-04-04 ENCOUNTER — Other Ambulatory Visit: Payer: Self-pay | Admitting: Family Medicine

## 2015-05-08 ENCOUNTER — Ambulatory Visit (INDEPENDENT_AMBULATORY_CARE_PROVIDER_SITE_OTHER): Payer: BLUE CROSS/BLUE SHIELD | Admitting: Physician Assistant

## 2015-05-08 VITALS — BP 115/60 | HR 70 | Temp 98.0°F | Resp 17 | Ht 76.0 in | Wt 208.4 lb

## 2015-05-08 DIAGNOSIS — L03818 Cellulitis of other sites: Secondary | ICD-10-CM | POA: Diagnosis not present

## 2015-05-08 DIAGNOSIS — Z872 Personal history of diseases of the skin and subcutaneous tissue: Secondary | ICD-10-CM | POA: Diagnosis not present

## 2015-05-08 DIAGNOSIS — L539 Erythematous condition, unspecified: Secondary | ICD-10-CM

## 2015-05-08 LAB — COMPREHENSIVE METABOLIC PANEL
ALT: 17 U/L (ref 0–53)
AST: 21 U/L (ref 0–37)
Albumin: 3.7 g/dL (ref 3.5–5.2)
Alkaline Phosphatase: 58 U/L (ref 39–117)
BILIRUBIN TOTAL: 1.2 mg/dL (ref 0.2–1.2)
BUN: 24 mg/dL — ABNORMAL HIGH (ref 6–23)
CALCIUM: 8.5 mg/dL (ref 8.4–10.5)
CO2: 28 mEq/L (ref 19–32)
CREATININE: 1.26 mg/dL (ref 0.50–1.35)
Chloride: 101 mEq/L (ref 96–112)
GLUCOSE: 85 mg/dL (ref 70–99)
POTASSIUM: 3.8 meq/L (ref 3.5–5.3)
Sodium: 139 mEq/L (ref 135–145)
Total Protein: 6.2 g/dL (ref 6.0–8.3)

## 2015-05-08 LAB — POCT CBC
GRANULOCYTE PERCENT: 88.8 % — AB (ref 37–80)
HEMATOCRIT: 44.4 % (ref 43.5–53.7)
HEMOGLOBIN: 14.5 g/dL (ref 14.1–18.1)
Lymph, poc: 0.7 (ref 0.6–3.4)
MCH, POC: 28.8 pg (ref 27–31.2)
MCHC: 32.6 g/dL (ref 31.8–35.4)
MCV: 88.2 fL (ref 80–97)
MID (cbc): 0.3 (ref 0–0.9)
MPV: 6.4 fL (ref 0–99.8)
PLATELET COUNT, POC: 155 10*3/uL (ref 142–424)
POC GRANULOCYTE: 7.8 — AB (ref 2–6.9)
POC LYMPH %: 7.8 % — AB (ref 10–50)
POC MID %: 3.4 % (ref 0–12)
RBC: 5.03 M/uL (ref 4.69–6.13)
RDW, POC: 13.3 %
WBC: 8.8 10*3/uL (ref 4.6–10.2)

## 2015-05-08 MED ORDER — CEPHALEXIN 500 MG PO CAPS: 500.0000 mg | ORAL_CAPSULE | Freq: Four times a day (QID) | ORAL | Status: DC

## 2015-05-08 NOTE — Progress Notes (Addendum)
05/08/2015 at 2:40 PM  Marni Griffon / DOB: 09-Oct-1946 / MRN: [id number]  The patient has LLQ pain and Constipation on his problem list.  SUBJECTIVE  Chief complaint: Cellulitis  Patient here with symptoms that started yesterday.  States he was seen at [facility] clinic yesterday for nausea and fever, and was placed on Azithromycin therapy for a sinus infection.  Also given outpatient phenergan and cough syrup. Has an AVS that shows he had a 17K white count with shift.  Today he woke up and noticed that his left 2nd toe was red and mildly tender, and since that time the redness has spread half way up the dorsum of the foot.  He denies a history of gout. He feels overall better today, aside from the skin redness, and has not needed any nausea medication.  Has taken tylenol this morning.  He has an allergy to penicillins, but had Cefdinir without incident in on 03/26/14.    He  has a past medical history of Diverticulosis; Asthma; and Inguinal hernia.    Medications reviewed and updated by myself where necessary, and exist elsewhere in the encounter.   Mr. Dicenzo is allergic to penicillins. He  reports that he has never smoked. He has never used smokeless tobacco. He reports that he drinks alcohol. He reports that he does not use illicit drugs. He  has no sexual activity history on file. The patient  has no past surgical history on file.  His family history includes Cancer in his mother; Colon cancer (age of onset: 2) in an other family member; Heart disease in his father; Lymphoma in his sister.  Review of Systems  Constitutional: Negative for fever and chills.  HENT: Negative for congestion and sore throat.   Eyes: Negative.   Respiratory: Negative for cough.   Cardiovascular: Negative for chest pain.  Gastrointestinal: Negative for heartburn.  Genitourinary: Negative for dysuria.  Musculoskeletal: Negative for myalgias.  Skin: Positive for rash.  Neurological: Negative for dizziness and  headaches.    OBJECTIVE  His  height is 6\' 4"  (1.93 m) and weight is 208 lb 6.4 oz (94.53 kg). His oral temperature is 98 F (36.7 C). His blood pressure is 115/60 and his pulse is 70. His respiration is 17 and oxygen saturation is 97%.  The patient's body mass index is 25.38 kg/(m^2).  Physical Exam  Vitals reviewed. Constitutional: He appears well-developed and well-nourished. No distress.  Cardiovascular: Normal rate and regular rhythm.   Respiratory: Effort normal and breath sounds normal.  Musculoskeletal: Normal range of motion.  Skin: Skin is warm. He is not diaphoretic. There is erythema.       Results for orders placed or performed in visit on 05/08/15 (from the past 24 hour(s))  POCT CBC     Status: Abnormal   Collection Time: 05/08/15  2:27 PM  Result Value Ref Range   WBC 8.8 4.6 - 10.2 K/uL   Lymph, poc 0.7 0.6 - 3.4   POC LYMPH PERCENT 7.8 (A) 10 - 50 %L   MID (cbc) 0.3 0 - 0.9   POC MID % 3.4 0 - 12 %M   POC Granulocyte 7.8 (A) 2 - 6.9   Granulocyte percent 88.8 (A) 37 - 80 %G   RBC 5.03 4.69 - 6.13 M/uL   Hemoglobin 14.5 14.1 - 18.1 g/dL   HCT, POC 44.4 43.5 - 53.7 %   MCV 88.2 80 - 97 fL   MCH, POC 28.8 27 - 31.2 pg  MCHC 32.6 31.8 - 35.4 g/dL   RDW, POC 13.3 %   Platelet Count, POC 155 142 - 424 K/uL   MPV 6.4 0 - 99.8 fL    ASSESSMENT & PLAN  Nayden was seen today for cellulitis.  Diagnoses and all orders for this visit:  Skin erythema Orders: -     POCT CBC -     Comprehensive metabolic panel  Cellulitis of other specified site: Patient with fascinating presentation. His white count was 17k yesterday and he has taken 500 mg Azithromycin and now has a normal white count with a decreased left shift today.  He denies nausea and fever today, but has worsening local cellulitis symptoms.  Azithromycin is an acceptable regimen for Strep infections in the pharynyx, but is not used for cellulitis, but it appears that it may have worked in this instance.   Patient advised to stay on this medication and to start Keflex.  No interactions found on Epocrates.    Orders: -     cephALEXin (KEFLEX) 500 MG capsule; Take 1 capsule (500 mg total) by mouth 4 (four) times daily.  History of cellulitis    The patient was advised to call or come back to clinic if he does not see an improvement in symptoms, or worsens with the above plan.   Philis Fendt, MHS, PA-C [facility] 05/08/2015 2:40 PM

## 2015-11-05 ENCOUNTER — Encounter: Payer: BLUE CROSS/BLUE SHIELD | Admitting: Physician Assistant

## 2015-11-05 ENCOUNTER — Other Ambulatory Visit: Payer: Self-pay | Admitting: Physician Assistant

## 2015-11-26 ENCOUNTER — Encounter: Payer: BLUE CROSS/BLUE SHIELD | Admitting: Physician Assistant

## 2015-12-10 ENCOUNTER — Ambulatory Visit (INDEPENDENT_AMBULATORY_CARE_PROVIDER_SITE_OTHER): Payer: BLUE CROSS/BLUE SHIELD | Admitting: Physician Assistant

## 2015-12-10 ENCOUNTER — Encounter: Payer: Self-pay | Admitting: Physician Assistant

## 2015-12-10 VITALS — BP 114/76 | HR 79 | Temp 98.2°F | Resp 16 | Ht 74.75 in | Wt 207.0 lb

## 2015-12-10 DIAGNOSIS — Z0000 Encounter for general adult medical examination without abnormal findings: Secondary | ICD-10-CM | POA: Diagnosis not present

## 2015-12-10 DIAGNOSIS — Z139 Encounter for screening, unspecified: Secondary | ICD-10-CM

## 2015-12-10 LAB — COMPLETE METABOLIC PANEL WITH GFR
ALT: 16 U/L (ref 9–46)
AST: 16 U/L (ref 10–35)
Albumin: 4.2 g/dL (ref 3.6–5.1)
Alkaline Phosphatase: 53 U/L (ref 40–115)
BUN: 16 mg/dL (ref 7–25)
CHLORIDE: 105 mmol/L (ref 98–110)
CO2: 27 mmol/L (ref 20–31)
CREATININE: 1.15 mg/dL (ref 0.70–1.18)
Calcium: 9.1 mg/dL (ref 8.6–10.3)
GFR, Est African American: 74 mL/min (ref >=60)
GFR, Est Non African American: 64 mL/min (ref >=60)
Glucose, Bld: 82 mg/dL (ref 65–99)
Potassium: 4.4 mmol/L (ref 3.5–5.3)
SODIUM: 141 mmol/L (ref 135–146)
Total Bilirubin: 1.1 mg/dL (ref 0.2–1.2)
Total Protein: 6.3 g/dL (ref 6.1–8.1)

## 2015-12-10 LAB — LIPID PANEL
CHOLESTEROL: 232 mg/dL — AB (ref 125–200)
HDL: 71 mg/dL (ref >=40)
LDL CALC: 146 mg/dL — AB (ref <130)
Total CHOL/HDL Ratio: 3.3 Ratio (ref <=5.0)
Triglycerides: 73 mg/dL (ref <150)
VLDL: 15 mg/dL (ref <30)

## 2015-12-10 LAB — CBC
HEMATOCRIT: 43.8 % (ref 39.0–52.0)
HEMOGLOBIN: 15.1 g/dL (ref 13.0–17.0)
MCH: 29.6 pg (ref 26.0–34.0)
MCHC: 34.5 g/dL (ref 30.0–36.0)
MCV: 85.9 fL (ref 78.0–100.0)
MPV: 9.1 fL (ref 8.6–12.4)
Platelets: 163 10*3/uL (ref 150–400)
RBC: 5.1 MIL/uL (ref 4.22–5.81)
RDW: 13.6 % (ref 11.5–15.5)
WBC: 4.9 10*3/uL (ref 4.0–10.5)

## 2015-12-10 NOTE — Progress Notes (Signed)
12/10/2015 3:29 PM   DOB: 1946/09/19 / MRN: [id number]  SUBJECTIVE:  Stephen Patterson is a 70 y.o. male presenting for an annual physical.  He has no complaints today.  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  Denies anhedonia and depression. His last colonoscopy was three years ago and this documentation exist in [facility].  He was advised to return in 10 years at that time.  He declines the all vaccinations today.    There is no immunization history on file for this patient.   He is allergic to penicillins.   He  has a past medical history of Diverticulosis; Asthma; and Inguinal hernia.    He  reports that he has never smoked. He has never used smokeless tobacco. He reports that he drinks alcohol. He reports that he does not use illicit drugs. He  reports that he currently engages in sexual activity. The patient  has past surgical history that includes Hernia repair.  His family history includes Cancer in his mother; Heart disease in his father; Lymphoma in his sister.  Review of Systems  Constitutional: Negative for fever and chills.  Eyes: Negative for blurred vision.  Respiratory: Negative for cough and shortness of breath.   Cardiovascular: Negative for chest pain.  Gastrointestinal: Negative for nausea and abdominal pain.  Genitourinary: Negative for dysuria, urgency and frequency.  Musculoskeletal: Negative for myalgias.  Skin: Negative for rash.  Neurological: Negative for dizziness, tingling and headaches.  Psychiatric/Behavioral: Negative for depression. The patient is not nervous/anxious.     Problem list and medications reviewed and updated by myself where necessary, and exist elsewhere in the encounter.   OBJECTIVE:  BP 114/76 mmHg  Pulse 79  Temp(Src) 98.2 F (36.8 C) (Oral)  Resp 16  Ht 6' 2.75" (1.899 m)  Wt 207 lb (93.895 kg)  BMI 26.04 kg/m2  SpO2 95%  Physical Exam  Constitutional: He is oriented to person,  place, and time. He appears well-developed. He does not appear ill.  Eyes: Conjunctivae and EOM are normal. Pupils are equal, round, and reactive to light.  Cardiovascular: Normal rate and regular rhythm.   Pulmonary/Chest: Effort normal and breath sounds normal.  Abdominal: He exhibits no distension.  Musculoskeletal: Normal range of motion.  Neurological: He is alert and oriented to person, place, and time. No cranial nerve deficit. Coordination normal.  Skin: Skin is warm and dry. He is not diaphoretic.  Psychiatric: He has a normal mood and affect.  Nursing note and vitals reviewed.   No results found for this or any previous visit (from the past 48 hour(s)).  ASSESSMENT AND PLAN  Stephen Patterson was seen today for annual exam.  Diagnoses and all orders for this visit:  Annual physical exam: Healthy 70 y.o. male here today for annual physical.  He declines flu, pneumonia and shingles vaccinations reporting that he does not need these.  I have advised against this and asked that he reconsider.  Will screen appropriately.    Screening -     Hepatitis C antibody -     HIV antibody -     Hemoglobin A1c -     TSH -     Lipid panel -     COMPLETE METABOLIC PANEL WITH GFR -     CBC   The patient was advised to call or return to clinic if he does not see an improvement in symptoms or to seek the care of  the closest emergency department if he worsens with the above plan.   Stephen Patterson, MHS, PA-C [facility] 12/10/2015 3:29 PM

## 2015-12-10 NOTE — Progress Notes (Signed)
   Subjective:    Patient ID: Stephen Patterson, male    DOB: Jun 06, 1946, 70 y.o.   MRN: [id number]  HPI    Review of Systems  Constitutional: Negative.   HENT: Negative.   Eyes: Negative.   Respiratory: Positive for shortness of breath.   Cardiovascular: Negative.   Gastrointestinal: Negative.   Endocrine: Negative.   Genitourinary: Negative.   Musculoskeletal: Negative.   Skin: Negative.   Allergic/Immunologic: Negative.   Neurological: Negative.   Hematological: Negative.   Psychiatric/Behavioral: Negative.        Objective:   Physical Exam        Assessment & Plan:

## 2015-12-11 LAB — TSH: TSH: 1.026 u[IU]/mL (ref 0.350–4.500)

## 2015-12-11 LAB — HEMOGLOBIN A1C
Hgb A1c MFr Bld: 5.4 % (ref <5.7)
Mean Plasma Glucose: 108 mg/dL (ref <117)

## 2015-12-11 LAB — HIV ANTIBODY (ROUTINE TESTING W REFLEX): HIV 1&2 Ab, 4th Generation: NONREACTIVE

## 2015-12-11 LAB — HEPATITIS C ANTIBODY: HCV Ab: NEGATIVE

## 2016-01-06 ENCOUNTER — Encounter: Payer: Self-pay | Admitting: Emergency Medicine

## 2016-01-06 ENCOUNTER — Emergency Department: Payer: BLUE CROSS/BLUE SHIELD

## 2016-01-06 ENCOUNTER — Emergency Department
Admission: EM | Admit: 2016-01-06 | Discharge: 2016-01-06 | Disposition: A | Discharge status: Home / Self Care | Payer: BLUE CROSS/BLUE SHIELD | Attending: Emergency Medicine | Admitting: Emergency Medicine

## 2016-01-06 DIAGNOSIS — Y9289 Other specified places as the place of occurrence of the external cause: Secondary | ICD-10-CM | POA: Diagnosis not present

## 2016-01-06 DIAGNOSIS — S79912A Unspecified injury of left hip, initial encounter: Secondary | ICD-10-CM | POA: Diagnosis present

## 2016-01-06 DIAGNOSIS — W01198A Fall on same level from slipping, tripping and stumbling with subsequent striking against other object, initial encounter: Secondary | ICD-10-CM | POA: Insufficient documentation

## 2016-01-06 DIAGNOSIS — S7002XA Contusion of left hip, initial encounter: Secondary | ICD-10-CM | POA: Diagnosis not present

## 2016-01-06 DIAGNOSIS — Y9389 Activity, other specified: Secondary | ICD-10-CM | POA: Diagnosis not present

## 2016-01-06 DIAGNOSIS — Z880 Allergy status to penicillin: Secondary | ICD-10-CM | POA: Diagnosis not present

## 2016-01-06 DIAGNOSIS — Y998 Other external cause status: Secondary | ICD-10-CM | POA: Diagnosis not present

## 2016-01-06 MED ORDER — TRAMADOL HCL 50 MG PO TABS: 50.0000 mg | ORAL_TABLET | Freq: Two times a day (BID) | ORAL | Status: DC | PRN

## 2016-01-06 MED ORDER — KETOROLAC TROMETHAMINE 60 MG/2ML IM SOLN
30.0000 mg | Freq: Once | INTRAMUSCULAR | Status: AC
  Administered 2016-01-06: 30 mg via INTRAMUSCULAR
  Filled 2016-01-06: qty 2

## 2016-01-06 NOTE — ED Notes (Signed)
Patient transported to X-ray 

## 2016-01-06 NOTE — ED Notes (Signed)
Pt discharged to home.  Family member driving.  Discharge instructions reviewed.  Verbalized understanding.  No questions or concerns at this time.  Teach back verified.  Pt in NAD.  No items left in ED.   

## 2016-01-06 NOTE — Discharge Instructions (Signed)

## 2016-01-06 NOTE — ED Provider Notes (Signed)
[facility] Emergency Department Provider Note  ____________________________________________  Time seen: Approximately 10:52 PM  I have reviewed the triage vital signs and the nursing notes.   HISTORY  Chief Complaint Fall    HPI Stephen Patterson is a 70 y.o. male patient complaining of left hip pain secondary to a fall. Patient state he fell backwards striking his posterior hip against a table. Patient state he was able to continue his workand then laid down to watch ballgame. Patient stated upon trying to stand up he felt sharp pain to the posterior hip. Patient state he had increased pain with ambulation until he arrived at the ER and his pain complaint decreased. Patient state he has taken Advil about 5 PM . Patient rated his pain as 8/10  Past Medical History  Diagnosis Date  . Diverticulosis   . Asthma   . Inguinal hernia     Patient Active Problem List   Diagnosis Date Noted  . LLQ pain 08/09/2012  . Constipation 08/09/2012    Past Surgical History  Procedure Laterality Date  . Hernia repair      Current Outpatient Rx  Name  Route  Sig  Dispense  Refill  . albuterol (VENTOLIN HFA) 108 (90 BASE) MCG/ACT inhaler   Inhalation   Inhale 2 puffs into the lungs every 4 (four) hours as needed. PATIENT NEEDS OFFICE VISIT FOR ADDITIONAL REFILLS   18 Inhaler   0   . traMADol (ULTRAM) 50 MG tablet   Oral   Take 1 tablet (50 mg total) by mouth every 12 (twelve) hours as needed for moderate pain.   12 tablet   0     Allergies Penicillins  Family History  Problem Relation Age of Onset  . Colon cancer  45    niece  . Cancer Mother     breast  . Heart disease Father   . Lymphoma Sister     Social History Social History  Substance Use Topics  . Smoking status: Never Smoker   . Smokeless tobacco: Never Used  . Alcohol Use: 0.0 oz/week    0 Standard drinks or equivalent per week     Comment: 2 per week    Review of  Systems Constitutional: No fever/chills Eyes: No visual changes. ENT: No sore throat. Cardiovascular: Denies chest pain. Respiratory: Denies shortness of breath. Gastrointestinal: No abdominal pain.  No nausea, no vomiting.  No diarrhea.  No constipation. Genitourinary: Negative for dysuria. Musculoskeletal: Left hip pain Skin: Negative for rash. Neurological: Negative for headaches, focal weakness or numbness. .  ____________________________________________   PHYSICAL EXAM:  VITAL SIGNS: ED Triage Vitals  Enc Vitals Group     BP 01/06/16 2204 150/86 mmHg     Pulse Rate 01/06/16 2204 80     Resp 01/06/16 2204 18     Temp 01/06/16 2204 97.7 F (36.5 C)     Temp Source 01/06/16 2204 Oral     SpO2 01/06/16 2204 96 %     Weight 01/06/16 2204 204 lb (92.534 kg)     Height 01/06/16 2204 6\' 4"  (1.93 m)     Head Cir --      Peak Flow --      Pain Score 01/06/16 2205 8     Pain Loc --      Pain Edu? --      Excl. in [place]? --     Constitutional: Alert and oriented. Well appearing and in no acute distress. Eyes: Conjunctivae  are normal. PERRL. EOMI. Head: Atraumatic. Nose: No congestion/rhinnorhea. Mouth/Throat: Mucous membranes are moist.  Oropharynx non-erythematous. Neck: No stridor.  No cervical spine tenderness to palpation. Hematological/Lymphatic/Immunilogical: No cervical lymphadenopathy. Cardiovascular: Normal rate, regular rhythm. Grossly normal heart sounds.  Good peripheral circulation. Respiratory: Normal respiratory effort.  No retractions. Lungs CTAB. Gastrointestinal: Soft and nontender. No distention. No abdominal bruits. No CVA tenderness. Musculoskeletal: No obvious deformity or leg length discrepancy and examine of the left hip. Patient is tender palpation of the greater trochanter area. Patient had full and equal range of motion. Neurologic:  Normal speech and language. No gross focal neurologic deficits are appreciated. No gait instability. Skin:  Skin is  warm, dry and intact. No rash noted. Psychiatric: Mood and affect are normal. Speech and behavior are normal.  ____________________________________________   LABS (all labs ordered are listed, but only abnormal results are displayed)  Labs Reviewed - No data to display ____________________________________________  EKG   ____________________________________________  RADIOLOGY  No acute findings on x-ray. I, Sable Feil, personally viewed and evaluated these images (plain radiographs) as part of my medical decision making, as well as reviewing the written report by the radiologist.  ____________________________________________   PROCEDURES  Procedure(s) performed: None  Critical Care performed: No  ____________________________________________   INITIAL IMPRESSION / ASSESSMENT AND PLAN / ED COURSE  Pertinent labs & imaging results that were available during my care of the patient were reviewed by me and considered in my medical decision making (see chart for details).  Left hip contusion secondary to fall. Discussed negative x-ray findings with palpation. Patient given injection of Toradol 30 mg IM. Patient given a prescription for tramadol to take as needed for pain for the next 2-3 days. Patient advised to follow-up family doctor for continued complaint. ____________________________________________   FINAL CLINICAL IMPRESSION(S) / ED DIAGNOSES  Final diagnoses:  Contusion of left hip, initial encounter      Sable Feil, PA-C 01/06/16 [place], MD 01/06/16 2342

## 2016-01-06 NOTE — ED Notes (Signed)
Pt arrived to the ED for complaints of left hip pain, swelling and bruicing secondary to a mechanical fall sustained today. Pt denies LOC. Pt is AOx4 in mild pain distress.

## 2016-01-15 ENCOUNTER — Ambulatory Visit (INDEPENDENT_AMBULATORY_CARE_PROVIDER_SITE_OTHER): Payer: BLUE CROSS/BLUE SHIELD | Admitting: Urgent Care

## 2016-01-15 VITALS — BP 140/80 | HR 70 | Temp 98.8°F | Resp 16 | Ht 76.0 in | Wt 212.0 lb

## 2016-01-15 DIAGNOSIS — S7012XA Contusion of left thigh, initial encounter: Secondary | ICD-10-CM | POA: Diagnosis not present

## 2016-01-15 DIAGNOSIS — Z872 Personal history of diseases of the skin and subcutaneous tissue: Secondary | ICD-10-CM

## 2016-01-15 DIAGNOSIS — S7002XA Contusion of left hip, initial encounter: Secondary | ICD-10-CM | POA: Diagnosis not present

## 2016-01-15 DIAGNOSIS — W1809XA Striking against other object with subsequent fall, initial encounter: Secondary | ICD-10-CM

## 2016-01-15 DIAGNOSIS — W1809XD Striking against other object with subsequent fall, subsequent encounter: Secondary | ICD-10-CM

## 2016-01-15 NOTE — Patient Instructions (Addendum)

## 2016-01-15 NOTE — Progress Notes (Signed)
    MRN: [id number] DOB: June 16, 1946  Subjective:   Stephen Patterson is a 70 y.o. 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 He was seen in the ED same day, x-rays were negative. He has seen been steadily improving, has a large bruise, was able to play golf. However, patient presents today really worried about cellulitis which he had before. Denies fever, redness, swelling, drainage of pus or bleeding.  Stephen Patterson has a current medication list which includes the following prescription(s): albuterol. Also is allergic to penicillins.  Stephen Patterson  has a past medical history of Diverticulosis; Asthma; and Inguinal hernia. Also  has past surgical history that includes Hernia repair.  Objective:   Vitals: BP 140/80 mmHg  Pulse 70  Temp(Src) 98.8 F (37.1 C) (Oral)  Resp 16  Ht 6\' 4"  (1.93 m)  Wt 212 lb (96.163 kg)  BMI 25.82 kg/m2  SpO2 97%  Physical Exam  Constitutional: He is oriented to person, place, and time. He appears well-developed and well-nourished.  Cardiovascular: Normal rate.   Pulmonary/Chest: Effort normal.  Musculoskeletal: He exhibits no edema or tenderness.  Neurological: He is alert and oriented to person, place, and time.  Skin: Skin is warm and dry. Ecchymosis (over area depicted) noted.       Assessment and Plan :   1. Fall against object, subsequent encounter 2. Contusion, thigh and hip, left, initial encounter 3. History of cellulitis - Reassured patient regarding cellulitis. Counseled him on symptoms, patient can call me if he develops fever, redness, tenderness and I will prescribe Keflex to cover for staph infections.  Jaynee Eagles, PA-C [facility] [phone] 01/15/2016 2:05 PM

## 2016-01-28 DEATH — deceased

## 2016-02-27 ENCOUNTER — Ambulatory Visit: Payer: BLUE CROSS/BLUE SHIELD

## 2016-02-28 DEATH — deceased

## 2016-03-03 DIAGNOSIS — Z85828 Personal history of other malignant neoplasm of skin: Secondary | ICD-10-CM | POA: Diagnosis not present

## 2016-03-03 DIAGNOSIS — L570 Actinic keratosis: Secondary | ICD-10-CM | POA: Diagnosis not present

## 2016-03-03 DIAGNOSIS — D485 Neoplasm of uncertain behavior of skin: Secondary | ICD-10-CM | POA: Diagnosis not present

## 2016-03-03 DIAGNOSIS — Z08 Encounter for follow-up examination after completed treatment for malignant neoplasm: Secondary | ICD-10-CM | POA: Diagnosis not present

## 2016-03-03 DIAGNOSIS — D225 Melanocytic nevi of trunk: Secondary | ICD-10-CM | POA: Diagnosis not present

## 2016-03-03 DIAGNOSIS — X32XXXD Exposure to sunlight, subsequent encounter: Secondary | ICD-10-CM | POA: Diagnosis not present

## 2016-03-03 DIAGNOSIS — L989 Disorder of the skin and subcutaneous tissue, unspecified: Secondary | ICD-10-CM | POA: Diagnosis not present

## 2016-03-19 DIAGNOSIS — L089 Local infection of the skin and subcutaneous tissue, unspecified: Secondary | ICD-10-CM | POA: Diagnosis not present

## 2016-03-19 DIAGNOSIS — D485 Neoplasm of uncertain behavior of skin: Secondary | ICD-10-CM | POA: Diagnosis not present

## 2016-05-05 ENCOUNTER — Other Ambulatory Visit: Payer: Self-pay

## 2016-05-05 NOTE — Telephone Encounter (Signed)
Pt needs a refill on albuterol (VENTOLIN HFA) 108 (90 BASE) MCG/ACT inhaler sent to [facility] on [address] in [place]. He stated the inhaler is not something that is ongoing and minor however is needed for exercise. No treatment necessary.  Please advise  [phone]

## 2016-05-06 MED ORDER — ALBUTEROL SULFATE HFA 108 (90 BASE) MCG/ACT IN AERS: 2.0000 | INHALATION_SPRAY | RESPIRATORY_TRACT | Status: DC | PRN

## 2016-05-06 NOTE — Telephone Encounter (Signed)
Stephen Patterson, pt has annual PE w/you in Jan, but I don't see asthma/inhaler discussed in [facility] notes since first Rxd here by Dr Brigitte Pulse 03/26/14. Do you want to give RFs?

## 2016-05-07 NOTE — Telephone Encounter (Signed)
Called pt and advised on Vm that RFs were sent.

## 2016-07-20 ENCOUNTER — Encounter: Payer: Self-pay | Admitting: Primary Care

## 2016-07-20 ENCOUNTER — Ambulatory Visit (INDEPENDENT_AMBULATORY_CARE_PROVIDER_SITE_OTHER): Payer: BLUE CROSS/BLUE SHIELD | Admitting: Primary Care

## 2016-07-20 VITALS — BP 114/76 | HR 70 | Temp 98.7°F | Ht 76.0 in | Wt 207.1 lb

## 2016-07-20 DIAGNOSIS — R600 Localized edema: Secondary | ICD-10-CM | POA: Diagnosis not present

## 2016-07-20 DIAGNOSIS — J4520 Mild intermittent asthma, uncomplicated: Secondary | ICD-10-CM | POA: Diagnosis not present

## 2016-07-20 MED ORDER — ALBUTEROL SULFATE HFA 108 (90 BASE) MCG/ACT IN AERS: 1.0000 | INHALATION_SPRAY | Freq: Four times a day (QID) | RESPIRATORY_TRACT | 5 refills | Status: DC | PRN

## 2016-07-20 NOTE — Assessment & Plan Note (Signed)
To bilateral ankles mostly. Exam today with mild edema bilaterally, no pitting. Discussed to use compression hose during long travel, elevate extremities at night. No symptoms of CHF. Renal function WNL. Will continue to monitor.

## 2016-07-20 NOTE — Assessment & Plan Note (Signed)
Diagnosed as a child. Mostly exercise induced. Uses Ventolin once monthly on average. Exam today unremarkable. Refills provided.

## 2016-07-20 NOTE — Progress Notes (Signed)
Pre visit review using our clinic review tool, if applicable. No additional management support is needed unless otherwise documented below in the visit note. 

## 2016-07-20 NOTE — Patient Instructions (Signed)
I sent refills of your Ventolin inhaler to the pharmacy.  Please schedule a physical with me in January 2018. You may also schedule a lab only appointment 3-4 days prior. We will discuss your lab results in detail during your physical.  It was a pleasure to meet you today! Please don't hesitate to call me with any questions. Welcome to [facility]!

## 2016-07-20 NOTE — Progress Notes (Signed)
Subjective:    Patient ID: Stephen Patterson, male    DOB: 1946/07/28, 70 y.o.   MRN: [id number]  HPI  Stephen Patterson is a 70 year old male who presents today to establish care and discuss the problems mentioned below. Will review old records. His last physical was in January 2017, he completed lab work as well.  1) Asthma: Diagnosed year ago. Exercise induced mostly. Uses an albuterol inhaler as needed for wheezing. He uses his albuterol inhaler once monthly on average. Previously managed on Advair but didn't find any improvement with use. Denies wheezing, shortness of breath, chest tightness. Feels well managed on albuterol PRN and is requesting refills.  2) Ankle Swelling: Intermittent. Located to bilateral ankles mostly, some lower extremity swelling. Mostly prominent with prolonged travel (flight, car). Swelling reduces with elevation. Denies cough, shortness of breath, calf pain, erythema. Overall not bothersome.  Review of Systems  Constitutional: Negative for unexpected weight change.  Respiratory: Negative for cough, shortness of breath and stridor.   Cardiovascular:       Bilateral ankle swelling, intermittent.  Musculoskeletal: Negative for myalgias.  Skin: Negative for color change.  Neurological: Negative for dizziness and headaches.       Past Medical History:  Diagnosis Date  . Asthma   . Diverticulosis   . Inguinal hernia      Social History   Social History  . Marital status: Married    Spouse name: N/A  . Number of children: 2  . 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 Smoking status: Never Smoker  . Smokeless tobacco: Never Used  . Alcohol use 0.0 oz/week     Comment: 2 per week  . Drug use: No  . Sexual activity: Yes   Other Topics Concern  . Not on file   Social History Narrative   Married.   2 children. 3 grandchildren.   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     Past Surgical History:  Procedure Laterality Date  . INGUINAL HERNIA REPAIR  2012,2015    Family History  Problem Relation Age of Onset  . Cancer Mother     breast  . Heart disease Father   . Lymphoma Sister   . Colon cancer  77    niece    Allergies  Allergen Reactions  . Penicillins     As a child unsure of reaction    No current outpatient prescriptions on file prior to visit.   No current facility-administered medications on file prior to visit.     BP 114/76   Pulse 70   Temp 98.7 F (37.1 C) (Oral)   Ht 6\' 4"  (1.93 m)   Wt 207 lb 1.9 oz (93.9 kg)   SpO2 96%   BMI 25.21 kg/m    Objective:   Physical Exam  Constitutional: He is oriented to person, place, and time. He appears well-nourished.  Neck: Neck supple.  Cardiovascular: Normal rate and regular Stephen.   Mild ankle edema noted. No pitting to lower extremities.  Pulmonary/Chest: Effort normal and breath sounds normal.  Musculoskeletal: Normal range of motion.  Neurological: He is alert and oriented to person, place, and time.  Skin: Skin is warm and dry.  Psychiatric: He has a normal mood and affect.  Assessment & Plan:

## 2016-08-12 DIAGNOSIS — H2513 Age-related nuclear cataract, bilateral: Secondary | ICD-10-CM | POA: Diagnosis not present

## 2016-08-12 DIAGNOSIS — Z0100 Encounter for examination of eyes and vision without abnormal findings: Secondary | ICD-10-CM | POA: Diagnosis not present

## 2016-09-14 DIAGNOSIS — H2512 Age-related nuclear cataract, left eye: Secondary | ICD-10-CM | POA: Diagnosis not present

## 2016-09-14 DIAGNOSIS — H25812 Combined forms of age-related cataract, left eye: Secondary | ICD-10-CM | POA: Diagnosis not present

## 2016-10-12 IMAGING — CR DG HIP (WITH OR WITHOUT PELVIS) 2-3V*L*
1 series · 4 of 4 positions shown · non-contrast
Comparison: None.

CLINICAL DATA: 6-year-old male with trauma and left hip pain.

EXAM:
DG HIP (WITH OR WITHOUT PELVIS) 2-3V LEFT

[Series 1: t pelvis ap · 0.14mm/px · 4 of 4 slices shown]
[im 1/4]
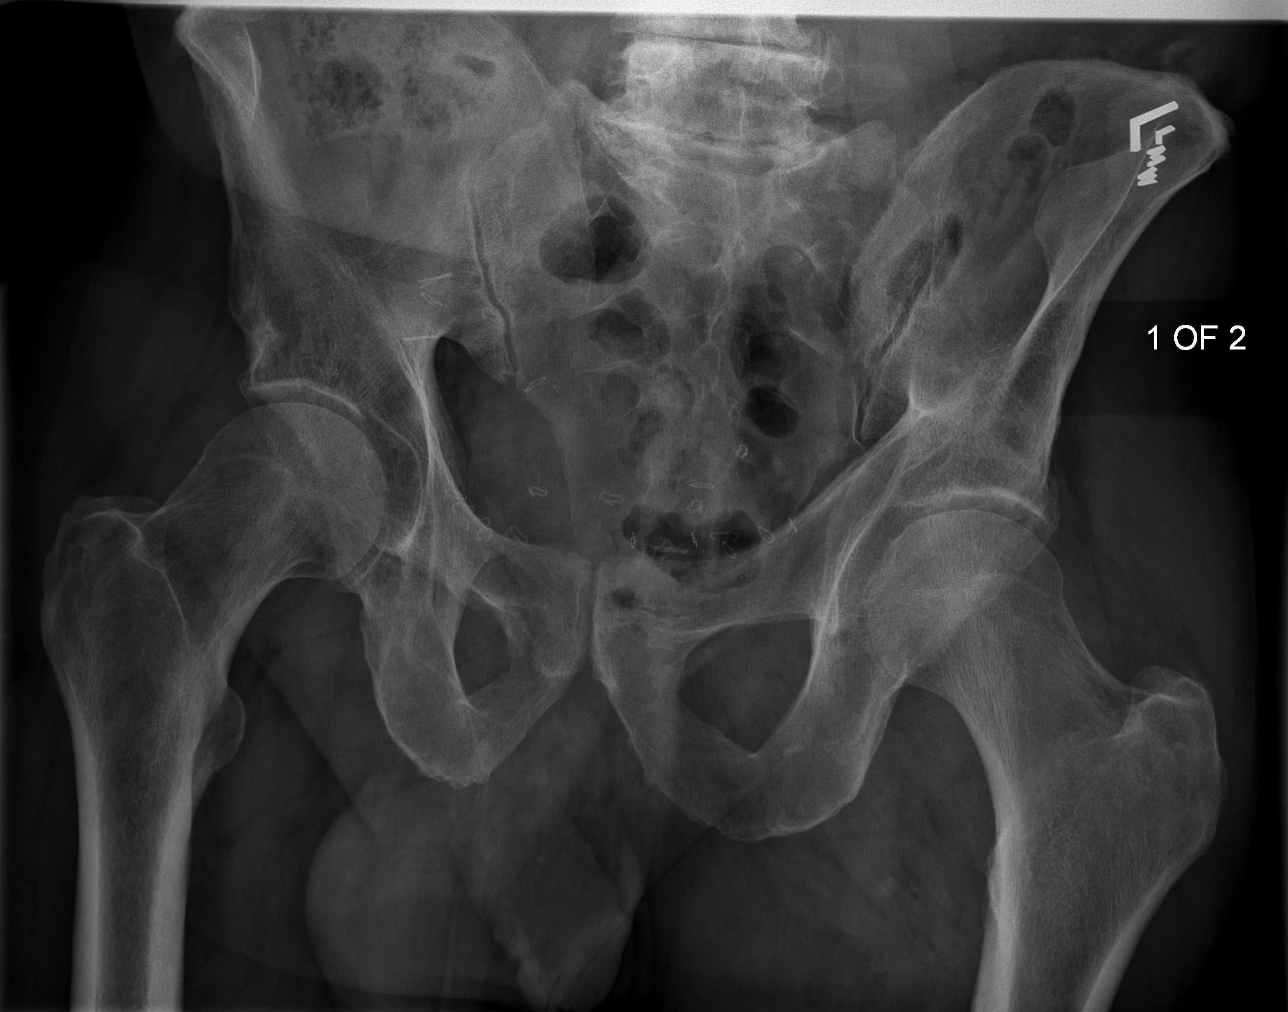
[im 2/4]
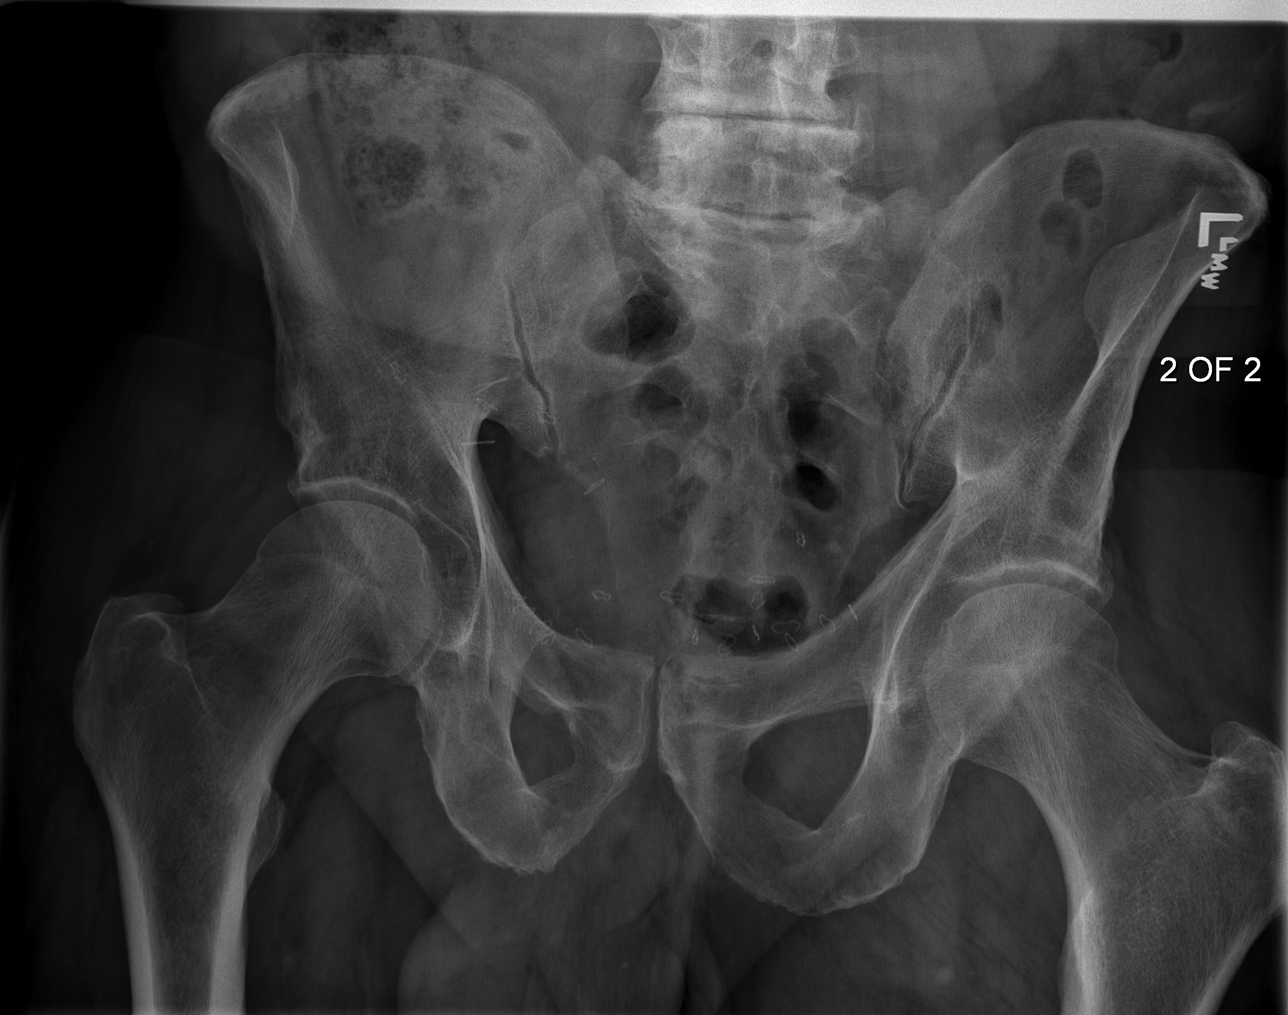
[im 3/4]
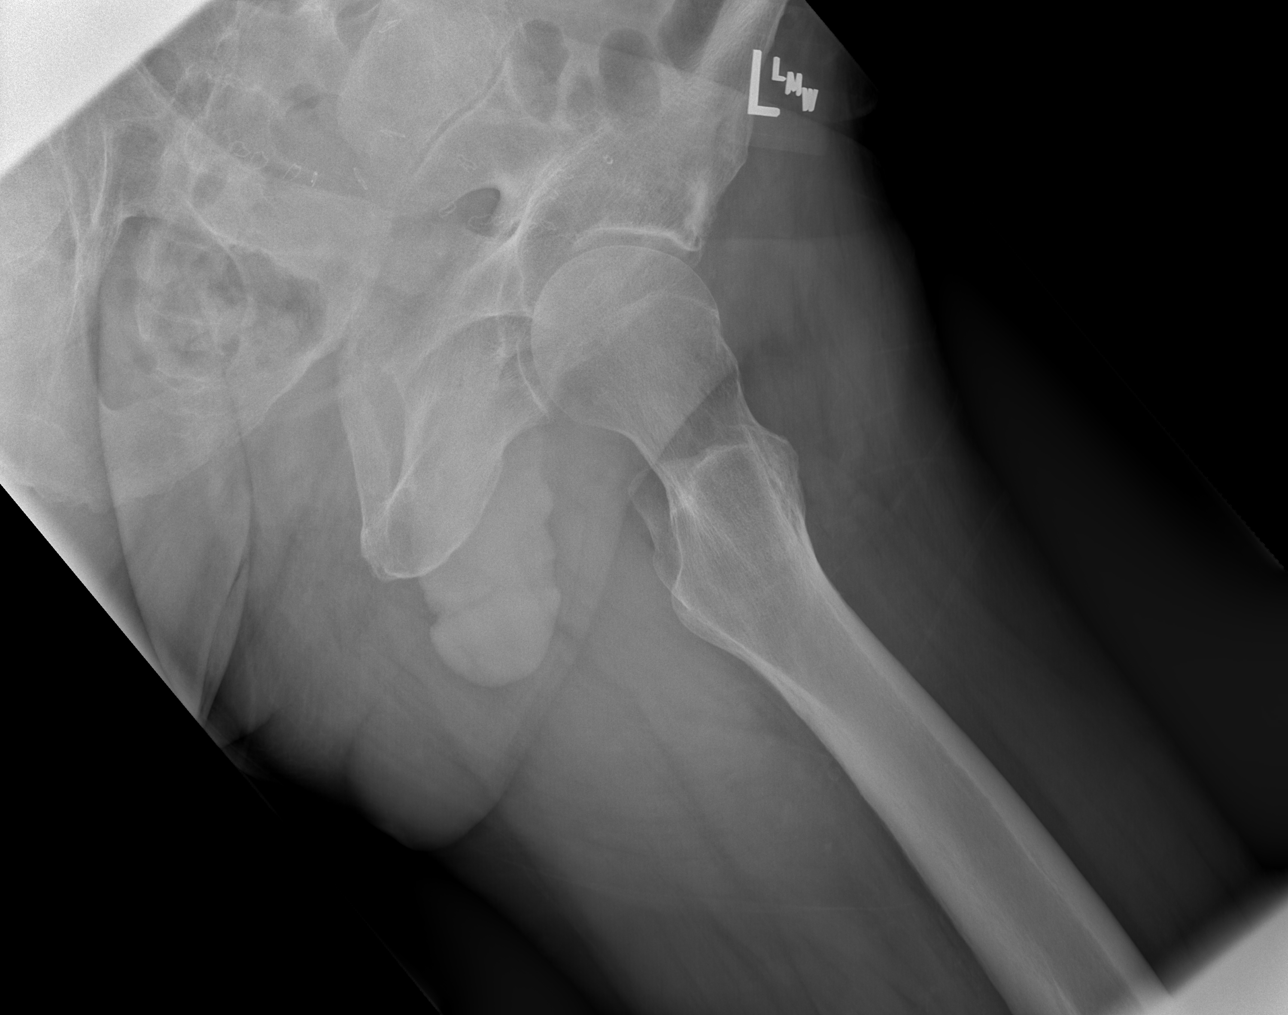
[im 4/4]
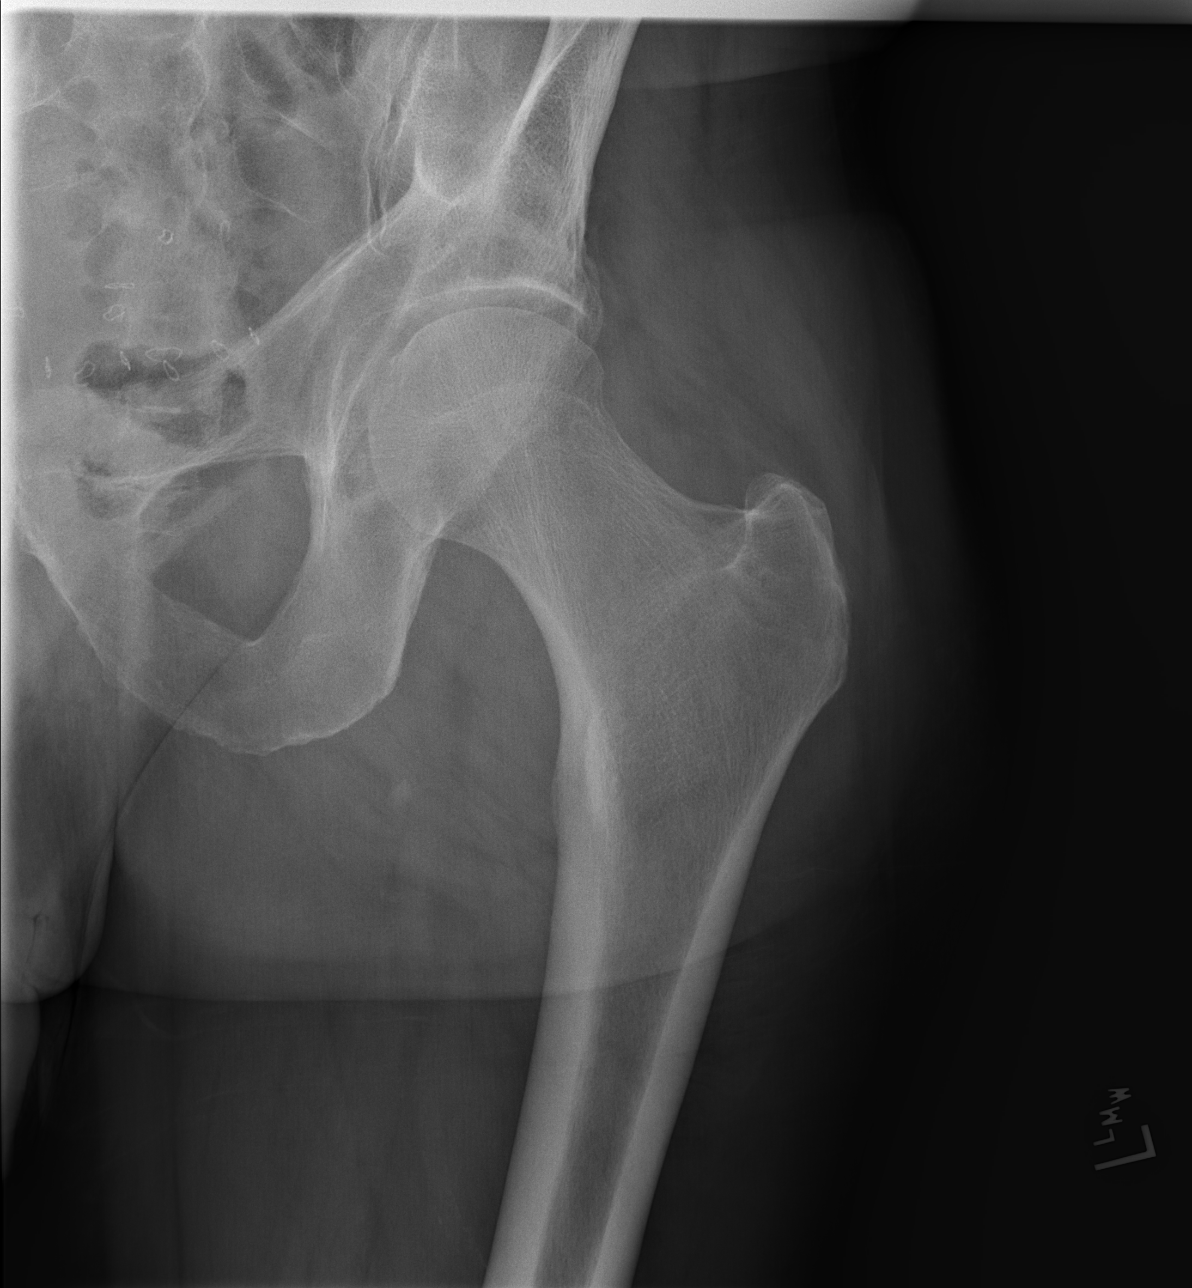

[4 of 4 positions shown; findings below may reference images not displayed]

FINDINGS: There is no acute fracture or dislocation. The folds are osteopenic.
There is mild osteoarthritic changes of the hips more prominent on
the left. Multiple surgical clips noted within the pelvis.
IMPRESSION: No acute fracture or dislocation.

## 2016-10-14 ENCOUNTER — Telehealth: Payer: Self-pay | Admitting: Primary Care

## 2016-10-14 NOTE — Telephone Encounter (Signed)
Patient says he doesn't use it every day but definitely uses it more than 2-3 times per week and he has done so for a number of years.  Patient says it works great for him and he is not interested in using any of the other meds on the market.

## 2016-10-14 NOTE — Telephone Encounter (Signed)
Please ask patient if he's using his albuterol inhaler more than 2-3 times weekly. I received notification from his insurance company that he's been refilling this monthly.

## 2016-10-15 NOTE — Telephone Encounter (Signed)
Noted. Please notify patient that I respect his choice, but please notify him that use of his albuterol inhaler more than 2-3 times weekly means that his asthma is uncontrolled.  He is at risk for decreased lung function in the future if his asthma goes improperly treated. He would greatly benefit from a daily controlled inhaler. The albuterol is only intended to be used as a rescue inhaler. I strongly recommend he consider this.

## 2016-10-15 NOTE — Telephone Encounter (Signed)
Message left for patient to return my call.  

## 2016-10-18 NOTE — Telephone Encounter (Signed)
Spoken and notified patient of Kate's comments. Patient verbalized understanding.  Patient will discuss it further with Anda Kraft when he plans to come in in January.

## 2016-10-18 NOTE — Telephone Encounter (Signed)
Noted  

## 2016-11-24 ENCOUNTER — Other Ambulatory Visit: Payer: Self-pay | Admitting: Primary Care

## 2016-11-24 DIAGNOSIS — E785 Hyperlipidemia, unspecified: Secondary | ICD-10-CM

## 2016-11-24 DIAGNOSIS — Z125 Encounter for screening for malignant neoplasm of prostate: Secondary | ICD-10-CM

## 2016-12-03 ENCOUNTER — Other Ambulatory Visit: Payer: BLUE CROSS/BLUE SHIELD

## 2016-12-07 ENCOUNTER — Ambulatory Visit (INDEPENDENT_AMBULATORY_CARE_PROVIDER_SITE_OTHER): Payer: BLUE CROSS/BLUE SHIELD | Admitting: Primary Care

## 2016-12-07 ENCOUNTER — Encounter: Payer: Self-pay | Admitting: Primary Care

## 2016-12-07 VITALS — BP 120/80 | HR 63 | Ht 74.5 in | Wt 205.0 lb

## 2016-12-07 DIAGNOSIS — Z1159 Encounter for screening for other viral diseases: Secondary | ICD-10-CM | POA: Diagnosis not present

## 2016-12-07 DIAGNOSIS — Z0000 Encounter for general adult medical examination without abnormal findings: Secondary | ICD-10-CM

## 2016-12-07 DIAGNOSIS — J4520 Mild intermittent asthma, uncomplicated: Secondary | ICD-10-CM | POA: Diagnosis not present

## 2016-12-07 DIAGNOSIS — R600 Localized edema: Secondary | ICD-10-CM

## 2016-12-07 LAB — PSA: PSA: 1.08 ng/mL (ref 0.10–4.00)

## 2016-12-07 LAB — COMPREHENSIVE METABOLIC PANEL
ALBUMIN: 4 g/dL (ref 3.5–5.2)
ALK PHOS: 57 U/L (ref 39–117)
ALT: 12 U/L (ref 0–53)
AST: 12 U/L (ref 0–37)
BILIRUBIN TOTAL: 1 mg/dL (ref 0.2–1.2)
BUN: 17 mg/dL (ref 6–23)
CALCIUM: 8.9 mg/dL (ref 8.4–10.5)
CO2: 30 mEq/L (ref 19–32)
CREATININE: 1.12 mg/dL (ref 0.40–1.50)
Chloride: 104 mEq/L (ref 96–112)
GFR: 68.65 mL/min (ref >60.00)
Glucose, Bld: 88 mg/dL (ref 70–99)
Potassium: 4.5 mEq/L (ref 3.5–5.1)
Sodium: 140 mEq/L (ref 135–145)
TOTAL PROTEIN: 6.3 g/dL (ref 6.0–8.3)

## 2016-12-07 LAB — LIPID PANEL
CHOL/HDL RATIO: 4
Cholesterol: 238 mg/dL — ABNORMAL HIGH (ref 0–200)
HDL: 59.1 mg/dL (ref >39.00)
LDL Cholesterol: 167 mg/dL — ABNORMAL HIGH (ref 0–99)
NonHDL: 179.32
TRIGLYCERIDES: 64 mg/dL (ref 0.0–149.0)
VLDL: 12.8 mg/dL (ref 0.0–40.0)

## 2016-12-07 NOTE — Assessment & Plan Note (Addendum)
Exercise induced, also colder weather. Empties 1 inhaler every 5 weeks. Sounds like he uses this inappropriately at times. He's tried ICS/LABA's in the past without improvement, explained numerous times that he likely needs an ICS at best, he kindly declines. No wheezing on exam, stable. Will continue to monitor.

## 2016-12-07 NOTE — Progress Notes (Signed)
Subjective:    Patient ID: Stephen Patterson, male    DOB: 1946/07/31, 71 y.o.   MRN: [id number]  HPI  Mr. Bibeau is a 71 year old male who presents today for complete physical.  Immunizations: -Tetanus: Up to date per patient. -Influenza: Declines -Pneumonia: Never completed, declines -Shingles: Never completed, declines  Diet: He endorses a fair diet. Breakfast: Sausage, bacon, egg, toast Lunch: Salad, grilled chicken Dinner: Meat, vegetables Snacks: Nuts, cheese Desserts: Rarely  Beverages: Water, un-sweet tea, little sodas  Exercise: Exercises 3-4  days weekly Eye exam: Completed in October, cataract removal Dental exam: Completes every 4 months Colonoscopy: Colonoscopy in 2014, due 2024 PSA: Due, pending   Review of Systems  Constitutional: Negative for unexpected weight change.  HENT: Negative for rhinorrhea.   Respiratory: Negative for cough and shortness of breath.   Cardiovascular: Negative for chest pain.  Gastrointestinal: Negative for constipation and diarrhea.  Genitourinary: Negative for difficulty urinating.  Musculoskeletal: Negative for arthralgias and myalgias.  Skin: Negative for rash.  Allergic/Immunologic: Negative for environmental allergies.  Neurological: Negative for dizziness, numbness and headaches.  Psychiatric/Behavioral:       Denies concerns for anxiety and depression       Past Medical History:  Diagnosis Date  . Asthma   . Diverticulosis   . Inguinal hernia      Social History   Social History  . Marital status: Married    Spouse name: N/A  . Number of children: 2  . 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 Smoking status: Never Smoker  . Smokeless tobacco: Never Used  . Alcohol use 0.0 oz/week     Comment: 2 per week  . Drug use: No  . Sexual activity: Yes   Other Topics Concern  . Not on file   Social History Narrative   Married.   2 children. 3  grandchildren.   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     Past Surgical History:  Procedure Laterality Date  . INGUINAL HERNIA REPAIR  2012,2015    Family History  Problem Relation Age of Onset  . Cancer Mother     breast  . Heart disease Father   . Lymphoma Sister   . Colon cancer  65    niece    Allergies  Allergen Reactions  . Penicillins     As a child unsure of reaction    Current Outpatient Prescriptions on File Prior to Visit  Medication Sig Dispense Refill  . albuterol (VENTOLIN HFA) 108 (90 Base) MCG/ACT inhaler Inhale 1-2 puffs into the lungs every 6 (six) hours as needed for wheezing or shortness of breath. 1 Inhaler 5   No current facility-administered medications on file prior to visit.     BP 120/80   Pulse 63   Ht 6' 2.5" (1.892 m)   Wt 205 lb (93 kg)   SpO2 98%   BMI 25.97 kg/m    Objective:   Physical Exam  Constitutional: He is oriented to person, place, and time. He appears well-nourished.  HENT:  Right Ear: Tympanic membrane and ear canal normal.  Left Ear: Tympanic membrane and ear canal normal.  Nose: Nose normal. Right sinus exhibits no maxillary sinus tenderness and no frontal sinus tenderness. Left sinus exhibits no maxillary sinus tenderness and  no frontal sinus tenderness.  Mouth/Throat: Oropharynx is clear and moist.  Eyes: Conjunctivae and EOM are normal. Pupils are equal, round, and reactive to light.  Neck: Neck supple. Carotid bruit is not present. No thyromegaly present.  Cardiovascular: Normal rate, regular rhythm and normal heart sounds.   Pulmonary/Chest: Effort normal and breath sounds normal. He has no wheezes. He has no rales.  Abdominal: Soft. Bowel sounds are normal. There is no tenderness.  Musculoskeletal: Normal range of motion.  Neurological: He is alert and oriented to person, place, and time. He has normal reflexes. No cranial nerve  deficit.  Skin: Skin is warm and dry.  Psychiatric: He has a normal mood and affect.          Assessment & Plan:

## 2016-12-07 NOTE — Patient Instructions (Signed)
Complete lab work prior to leaving today. I will notify you of your results once received.   Continue your efforts towards a healthy lifestyle through weight loss and healthy diet.  Continue exercising. You should be getting 150 minutes of moderate intensity exercise weekly.  Ensure you are consuming 64 ounces of water daily.  Follow up in 1 year for your annual physical or sooner if needed.  It was a pleasure to see you today!

## 2016-12-07 NOTE — Assessment & Plan Note (Signed)
Improved since changing his diet. Exercising more frequently. CMP pending.

## 2016-12-07 NOTE — Assessment & Plan Note (Signed)
Td UTD per patient. Declines influenza vaccination, shingles, pneumonia. Colonoscopy UTD. PSA pending. Exam unremarkable. Labs pending. Follow up in 1 year.

## 2016-12-08 LAB — HEPATITIS C ANTIBODY: HCV Ab: NEGATIVE

## 2016-12-09 ENCOUNTER — Encounter: Payer: Self-pay | Admitting: *Deleted

## 2016-12-29 DIAGNOSIS — B078 Other viral warts: Secondary | ICD-10-CM | POA: Diagnosis not present

## 2016-12-29 DIAGNOSIS — C44519 Basal cell carcinoma of skin of other part of trunk: Secondary | ICD-10-CM | POA: Diagnosis not present

## 2016-12-29 DIAGNOSIS — C4441 Basal cell carcinoma of skin of scalp and neck: Secondary | ICD-10-CM | POA: Diagnosis not present

## 2016-12-29 DIAGNOSIS — D225 Melanocytic nevi of trunk: Secondary | ICD-10-CM | POA: Diagnosis not present

## 2016-12-29 DIAGNOSIS — X32XXXD Exposure to sunlight, subsequent encounter: Secondary | ICD-10-CM | POA: Diagnosis not present

## 2016-12-29 DIAGNOSIS — L570 Actinic keratosis: Secondary | ICD-10-CM | POA: Diagnosis not present

## 2017-01-08 ENCOUNTER — Other Ambulatory Visit: Payer: Self-pay | Admitting: Primary Care

## 2017-01-08 DIAGNOSIS — J4520 Mild intermittent asthma, uncomplicated: Secondary | ICD-10-CM

## 2017-02-02 DIAGNOSIS — C4441 Basal cell carcinoma of skin of scalp and neck: Secondary | ICD-10-CM | POA: Diagnosis not present

## 2017-02-02 DIAGNOSIS — C44519 Basal cell carcinoma of skin of other part of trunk: Secondary | ICD-10-CM | POA: Diagnosis not present

## 2017-06-16 ENCOUNTER — Encounter: Payer: Self-pay | Admitting: Primary Care

## 2017-06-16 ENCOUNTER — Ambulatory Visit (INDEPENDENT_AMBULATORY_CARE_PROVIDER_SITE_OTHER): Payer: BLUE CROSS/BLUE SHIELD | Admitting: Primary Care

## 2017-06-16 VITALS — BP 136/84 | HR 71 | Temp 97.9°F | Wt 204.4 lb

## 2017-06-16 DIAGNOSIS — M542 Cervicalgia: Secondary | ICD-10-CM | POA: Diagnosis not present

## 2017-06-16 NOTE — Progress Notes (Signed)
   Subjective:    Patient ID: Stephen Patterson, male    DOB: Jan 31, 1946, 71 y.o.   MRN: [id number]  HPI  Stephen Patterson is a 71 year old male who presents today with a chief complaint of neck pain. His pain is located to the left lateral neck. His pain began about two months ago and is worse with lateral rotation and bending to the left side. His pain is worse in the morning when waking, improved and less stiff throughout the day. He exercises regularly and is unsure if he "tweaked" the neck while in the gym. He's not taken anything OTC for his symptoms.   He denies trauma, numbness/tingling, radiation of pain to his upper extremities and spine.  Review of Systems  Constitutional: Negative for fever.  Musculoskeletal: Positive for neck pain and neck stiffness. Negative for back pain.  Skin: Negative for color change.  Neurological: Negative for weakness and numbness.       Past Medical History:  Diagnosis Date  . Asthma   . Diverticulosis   . Inguinal hernia      Social History   Social History  . Marital status: Married    Spouse name: N/A  . Number of children: 2  . 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 Smoking status: Never Smoker  . Smokeless tobacco: Never Used  . Alcohol use 0.0 oz/week     Comment: 2 per week  . Drug use: No  . Sexual activity: Yes   Other Topics Concern  . Not on file   Social History Narrative   Married.   2 children. 3 grandchildren.   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     Past Surgical History:  Procedure Laterality Date  . INGUINAL HERNIA REPAIR  2012,2015    Family History  Problem Relation Age of Onset  . Cancer Mother        breast  . Heart disease Father   . Lymphoma Sister   . Colon cancer Unknown 64       niece    Allergies  Allergen Reactions  . Penicillins     As a child  unsure of reaction    Current Outpatient Prescriptions on File Prior to Visit  Medication Sig Dispense Refill  . PROAIR HFA 108 (90 Base) MCG/ACT inhaler inhale 1-2 puffs by mouth every 6 hours if needed for wheezing or shortness of breath 8.5 g 5   No current facility-administered medications on file prior to visit.     BP 136/84   Pulse 71   Temp 97.9 F (36.6 C) (Oral)   Wt 204 lb 6.4 oz (92.7 kg)   SpO2 95%   BMI 25.89 kg/m    Objective:   Physical Exam  Constitutional: He appears well-nourished.  Neck: Neck supple.    Decrease in ROM with left lateral rotation and bending.  Cardiovascular: Normal rate.   Pulmonary/Chest: Effort normal and breath sounds normal. He has no wheezes.  Skin: Skin is warm and dry.          Assessment & Plan:

## 2017-06-16 NOTE — Patient Instructions (Signed)
Try taking Advil/Ibuprofen 600 mg three times daily or just before bedtime for the next week for pain and stiffness.  Work on stretching exercises as discussed. Application of heat may help loosen up the muscle.  Please notify me if no improvement in 2-3 weeks.  It was a pleasure to see you today!

## 2017-06-24 ENCOUNTER — Other Ambulatory Visit: Payer: Self-pay | Admitting: Primary Care

## 2017-06-24 DIAGNOSIS — J4520 Mild intermittent asthma, uncomplicated: Secondary | ICD-10-CM

## 2017-07-19 DIAGNOSIS — X32XXXD Exposure to sunlight, subsequent encounter: Secondary | ICD-10-CM | POA: Diagnosis not present

## 2017-07-19 DIAGNOSIS — C4441 Basal cell carcinoma of skin of scalp and neck: Secondary | ICD-10-CM | POA: Diagnosis not present

## 2017-07-19 DIAGNOSIS — L570 Actinic keratosis: Secondary | ICD-10-CM | POA: Diagnosis not present

## 2017-09-08 DIAGNOSIS — H2511 Age-related nuclear cataract, right eye: Secondary | ICD-10-CM | POA: Diagnosis not present

## 2017-09-08 DIAGNOSIS — H524 Presbyopia: Secondary | ICD-10-CM | POA: Diagnosis not present

## 2017-10-11 DIAGNOSIS — H25011 Cortical age-related cataract, right eye: Secondary | ICD-10-CM | POA: Diagnosis not present

## 2017-10-11 DIAGNOSIS — H2511 Age-related nuclear cataract, right eye: Secondary | ICD-10-CM | POA: Diagnosis not present

## 2017-10-11 DIAGNOSIS — H25811 Combined forms of age-related cataract, right eye: Secondary | ICD-10-CM | POA: Diagnosis not present

## 2017-10-21 ENCOUNTER — Encounter: Payer: Self-pay | Admitting: Family Medicine

## 2017-10-21 ENCOUNTER — Ambulatory Visit (INDEPENDENT_AMBULATORY_CARE_PROVIDER_SITE_OTHER): Payer: BLUE CROSS/BLUE SHIELD | Admitting: Family Medicine

## 2017-10-21 VITALS — BP 132/80 | HR 62 | Temp 98.6°F | Ht 74.0 in | Wt 204.9 lb

## 2017-10-21 DIAGNOSIS — J029 Acute pharyngitis, unspecified: Secondary | ICD-10-CM | POA: Diagnosis not present

## 2017-10-21 DIAGNOSIS — B9789 Other viral agents as the cause of diseases classified elsewhere: Secondary | ICD-10-CM

## 2017-10-21 DIAGNOSIS — J069 Acute upper respiratory infection, unspecified: Secondary | ICD-10-CM

## 2017-10-21 LAB — POCT RAPID STREP A (OFFICE): RAPID STREP A SCREEN: NEGATIVE

## 2017-10-21 MED ORDER — BENZONATATE 100 MG PO CAPS: 100.0000 mg | ORAL_CAPSULE | Freq: Three times a day (TID) | ORAL | 0 refills | Status: DC | PRN

## 2017-10-21 NOTE — Progress Notes (Signed)
Chief Complaint  Patient presents with  . Acute Visit  . Sore Throat  . Cough    Stephen Patterson here for URI complaints.  Duration: 8 d Associated symptoms: ST, cough Denies: sinus congestion, sinus pain, rhinorrhea, itchy watery eyes, ear pain, ear drainage, wheezing, shortness of breath, myalgia and fevers/rigors Treatment to date: Mucinex, Clindamycin (left over from dental procedure) Sick contacts: No  ROS:  Const: Denies fevers HEENT: As noted in HPI Lungs: No SOB  Past Medical History:  Diagnosis Date  . Asthma   . Diverticulosis   . Inguinal hernia    Family History  Problem Relation Age of Onset  . Cancer Mother        breast  . Heart disease Father   . Lymphoma Sister   . Colon cancer Unknown 30       niece    BP 132/80   Pulse 62   Temp 98.6 F (37 C) (Oral)   Ht 6\' 2"  (1.88 m)   Wt 204 lb 14.4 oz (92.9 kg)   SpO2 97%   BMI 26.31 kg/m  General: Awake, alert, appears stated age HEENT: AT, [place], ears patent b/l and TM's neg, nares patent w/o discharge, pharynx pink and without exudates, MMM Neck: No masses or asymmetry Heart: RRR, no murmurs, no bruits Lungs: CTAB, no accessory muscle use Psych: Age appropriate judgment and insight, normal mood and affect  Viral URI with cough  Sorethroat - Plan: POCT rapid strep A  Rapid strep neg, Tessalon Perles for symptom management.  Continue to push fluids, practice good hand hygiene, cover mouth when coughing. F/u prn. If starting to experience fevers, shaking, or shortness of breath, seek immediate care. Pt voiced understanding and agreement to the plan.  Lyons Switch, DO 10/21/17 11:51 AM

## 2017-10-21 NOTE — Progress Notes (Signed)
Pre visit review using our clinic review tool, if applicable. No additional management support is needed unless otherwise documented below in the visit note. 

## 2017-10-21 NOTE — Patient Instructions (Signed)
Continue to push fluids, practice good hand hygiene, and cover your mouth if you cough.  If you start having fevers, shaking or shortness of breath, seek immediate care.  If this was bacterial, you likely would have had near full improvement by now. This is likely viral and antibiotics would be harmful rather than helpful.  Let us know if you need anything.

## 2017-12-10 ENCOUNTER — Other Ambulatory Visit: Payer: Self-pay | Admitting: Primary Care

## 2017-12-10 DIAGNOSIS — J4520 Mild intermittent asthma, uncomplicated: Secondary | ICD-10-CM

## 2017-12-12 NOTE — Telephone Encounter (Signed)
Electronic refill request Last refill 06/24/17 18G/5 Last office visit 06/16/17

## 2017-12-12 NOTE — Telephone Encounter (Signed)
Patient is due for his CPE, please schedule with lab appointment 1-2 days prior. We will send in one inhaler until he can be seen.

## 2017-12-13 NOTE — Telephone Encounter (Signed)
Letter sent to patient to schedule CPE.

## 2018-01-11 ENCOUNTER — Encounter: Payer: Self-pay | Admitting: Primary Care

## 2018-01-11 ENCOUNTER — Ambulatory Visit (INDEPENDENT_AMBULATORY_CARE_PROVIDER_SITE_OTHER): Payer: BLUE CROSS/BLUE SHIELD | Admitting: Primary Care

## 2018-01-11 VITALS — BP 124/80 | HR 64 | Temp 98.7°F | Ht 74.0 in | Wt 209.0 lb

## 2018-01-11 DIAGNOSIS — J4520 Mild intermittent asthma, uncomplicated: Secondary | ICD-10-CM | POA: Diagnosis not present

## 2018-01-11 DIAGNOSIS — Z0000 Encounter for general adult medical examination without abnormal findings: Secondary | ICD-10-CM | POA: Diagnosis not present

## 2018-01-11 LAB — COMPREHENSIVE METABOLIC PANEL WITH GFR
ALT: 11 U/L (ref 0–53)
AST: 12 U/L (ref 0–37)
Albumin: 3.9 g/dL (ref 3.5–5.2)
Alkaline Phosphatase: 56 U/L (ref 39–117)
BUN: 17 mg/dL (ref 6–23)
CO2: 30 meq/L (ref 19–32)
Calcium: 8.7 mg/dL (ref 8.4–10.5)
Chloride: 106 meq/L (ref 96–112)
Creatinine, Ser: 1.2 mg/dL (ref 0.40–1.50)
GFR: 63.2 mL/min
Glucose, Bld: 89 mg/dL (ref 70–99)
Potassium: 4.1 meq/L (ref 3.5–5.1)
Sodium: 139 meq/L (ref 135–145)
Total Bilirubin: 1 mg/dL (ref 0.2–1.2)
Total Protein: 6.6 g/dL (ref 6.0–8.3)

## 2018-01-11 LAB — LIPID PANEL
Cholesterol: 214 mg/dL — ABNORMAL HIGH (ref 0–200)
HDL: 59.5 mg/dL
LDL Cholesterol: 142 mg/dL — ABNORMAL HIGH (ref 0–99)
NonHDL: 154.62
Total CHOL/HDL Ratio: 4
Triglycerides: 63 mg/dL (ref 0.0–149.0)
VLDL: 12.6 mg/dL (ref 0.0–40.0)

## 2018-01-11 NOTE — Assessment & Plan Note (Addendum)
Uses daily, more frequently during exercise and cold weather. He thinks this is more psychological to use the albuterol daily.   Discussed controller medications for which he continues to decline. Discussed to avoid recurrent use of albuterol, limit albuterol to PRN use only, max of three times weekly.  Exam today unremarkable.

## 2018-01-11 NOTE — Patient Instructions (Addendum)
Stop by the lab prior to leaving today. I will notify you of your results once received.   Limit use of albuterol, use only as needed. Try not to exceed three times weekly.  Continue exercising. You should be getting 150 minutes of moderate intensity exercise weekly.  Increase vegetables, fruit, whole grains, water.  Ensure you are consuming 64 ounces of water daily.  Follow up in 1 year for your annual exam or sooner if needed.  It was a pleasure to see you today!   Preventive Care 73 Years and Older, Male Preventive care refers to lifestyle choices and visits with your health care provider that can promote health and wellness. What does preventive care include?  A yearly physical exam. This is also called an annual well check.  Dental exams once or twice a year.  Routine eye exams. Ask your health care provider how often you should have your eyes checked.  Personal lifestyle choices, including: ? Daily care of your teeth and gums. ? Regular physical activity. ? Eating a healthy diet. ? Avoiding tobacco and drug use. ? Limiting alcohol use. ? Practicing safe sex. ? Taking low doses of aspirin every day. ? Taking vitamin and mineral supplements as recommended by your health care provider. What happens during an annual well check? The services and screenings done by your health care provider during your annual well check will depend on your age, overall health, lifestyle risk factors, and family history of disease. Counseling Your health care provider may ask you questions about your:  Alcohol use.  Tobacco use.  Drug use.  Emotional well-being.  Home and relationship well-being.  Sexual activity.  Eating habits.  History of falls.  Memory and ability to understand (cognition).  Work and work Statistician.  Screening You may have the following tests or measurements:  Height, weight, and BMI.  Blood pressure.  Lipid and cholesterol levels. These may be  checked every 5 years, or more frequently if you are over 47 years old.  Skin check.  Lung cancer screening. You may have this screening every year starting at age 57 if you have a 30-pack-year history of smoking and currently smoke or have quit within the past 15 years.  Fecal occult blood test (FOBT) of the stool. You may have this test every year starting at age 6.  Flexible sigmoidoscopy or colonoscopy. You may have a sigmoidoscopy every 5 years or a colonoscopy every 10 years starting at age 70.  Prostate cancer screening. Recommendations will vary depending on your family history and other risks.  Hepatitis C blood test.  Hepatitis B blood test.  Sexually transmitted disease (STD) testing.  Diabetes screening. This is done by checking your blood sugar (glucose) after you have not eaten for a while (fasting). You may have this done every 1-3 years.  Abdominal aortic aneurysm (AAA) screening. You may need this if you are a current or former smoker.  Osteoporosis. You may be screened starting at age 66 if you are at high risk.  Talk with your health care provider about your test results, treatment options, and if necessary, the need for more tests. Vaccines Your health care provider may recommend certain vaccines, such as:  Influenza vaccine. This is recommended every year.  Tetanus, diphtheria, and acellular pertussis (Tdap, Td) vaccine. You may need a Td booster every 10 years.  Varicella vaccine. You may need this if you have not been vaccinated.  Zoster vaccine. You may need this after age 64.  Measles, mumps, and rubella (MMR) vaccine. You may need at least one dose of MMR if you were born in 1957 or later. You may also need a second dose.  Pneumococcal 13-valent conjugate (PCV13) vaccine. One dose is recommended after age 110.  Pneumococcal polysaccharide (PPSV23) vaccine. One dose is recommended after age 10.  Meningococcal vaccine. You may need this if you have  certain conditions.  Hepatitis A vaccine. You may need this if you have certain conditions or if you travel or work in places where you may be exposed to hepatitis A.  Hepatitis B vaccine. You may need this if you have certain conditions or if you travel or work in places where you may be exposed to hepatitis B.  Haemophilus influenzae type b (Hib) vaccine. You may need this if you have certain risk factors.  Talk to your health care provider about which screenings and vaccines you need and how often you need them. This information is not intended to replace advice given to you by your health care provider. Make sure you discuss any questions you have with your health care provider. Document Released: 12/12/2015 Document Revised: 08/04/2016 Document Reviewed: 09/16/2015 Elsevier Interactive Patient Education  [employer].

## 2018-01-11 NOTE — Assessment & Plan Note (Signed)
Declines all immunizations including influenza and pneumonia. PSA UTD Colonoscopy UTD. Encouraged to increase water intake, vegetables, fruit, whole grains. Commended him on regular exercise. Exam unremarkable. Labs pending. Follow up in 1 year.

## 2018-01-11 NOTE — Progress Notes (Signed)
Subjective:    Patient ID: TAMIKA NOU, male    DOB: 1946-05-03, 72 y.o.   MRN: [id number]  HPI  Mr. Summons is a 72 year old male who presents today for complete physical.  Immunizations: -Tetanus: Up to date per patient -Influenza: Declines  -Pneumonia: Declines -Shingles: Declines   Diet: He endorses a fair diet. Breakfast: Oatmeal, sausage and egg Lunch: Salad, restaurants/fast food (healthy options) Dinner: Grilled chicken, beans, steak, vegetables Snacks: Cookies, nuts, cheese Desserts: 3-4 days weekly Beverages: Some water, sweet tea, coffee, occasional alcohol  Exercise: He exercises 3-4 days weekly at the gym Eye exam: Completed in October 2018 Dental exam: Completes three times annually Colonoscopy: Completed in 2014, due in 2024 PSA: Negative in 2018 Hep C Screen: Negative in 2018   Review of Systems  Constitutional: Negative for unexpected weight change.  HENT: Negative for rhinorrhea.   Respiratory: Negative for cough and shortness of breath.   Cardiovascular: Negative for chest pain.  Gastrointestinal: Negative for constipation and diarrhea.  Genitourinary: Negative for difficulty urinating.  Musculoskeletal: Negative for arthralgias and myalgias.  Skin: Negative for rash.  Allergic/Immunologic: Negative for environmental allergies.  Neurological: Negative for dizziness, numbness and headaches.       Past Medical History:  Diagnosis Date  . Asthma   . Diverticulosis   . Inguinal hernia      Social History   Socioeconomic History  . Marital status: Married    Spouse name: Not on file  . Number of children: 2  . Years of education: Not on file  . Highest education level: Not on file  Social Needs  . Financial resource strain: Not on file  . Food insecurity - worry: Not on file  . Food insecurity - inability: Not on file  . Transportation needs - medical: Not on file  . Transportation needs - non-medical: Not on file  Occupational History   . Occupation: Orthoptist  Tobacco Use  . Smoking status: Never Smoker  . Smokeless tobacco: Never Used  Substance and Sexual Activity  . Alcohol use: Yes    Alcohol/week: 0.0 oz    Comment: 2 per week  . Drug use: No  . Sexual activity: Yes  Other Topics Concern  . Not on file  Social History Narrative   Married.   2 children. 3 grandchildren.   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     Past Surgical History:  Procedure Laterality Date  . INGUINAL HERNIA REPAIR  2012,2015    Family History  Problem Relation Age of Onset  . Cancer Mother        breast  . Heart disease Father   . Lymphoma Sister   . Colon cancer Unknown 51       niece    Allergies  Allergen Reactions  . Penicillin G     Other reaction(s): Unknown  . Penicillins     As a child unsure of reaction    Current Outpatient Medications on File Prior to Visit  Medication Sig Dispense Refill  . VENTOLIN HFA 108 (90 Base) MCG/ACT inhaler inhale 1 to 2 puffs by mouth every 6 hours if needed for wheezing 18 g 0   No current facility-administered medications on file prior to visit.     BP 124/80   Pulse 64   Temp 98.7 F (37.1 C) (Oral)   Ht  6\' 2"  (1.88 m)   Wt 209 lb (94.8 kg)   SpO2 97%   BMI 26.83 kg/m    Objective:   Physical Exam  Constitutional: He is oriented to person, place, and time. He appears well-nourished.  HENT:  Right Ear: Tympanic membrane and ear canal normal.  Left Ear: Tympanic membrane and ear canal normal.  Nose: Nose normal. Right sinus exhibits no maxillary sinus tenderness and no frontal sinus tenderness. Left sinus exhibits no maxillary sinus tenderness and no frontal sinus tenderness.  Mouth/Throat: Oropharynx is clear and moist.  Eyes: Conjunctivae and EOM are normal. Pupils are equal, round, and reactive to light.  Neck: Neck supple. Carotid bruit is not present. No thyromegaly  present.  Cardiovascular: Normal rate, regular rhythm and normal heart sounds.  Pulmonary/Chest: Effort normal and breath sounds normal. He has no wheezes. He has no rales.  Abdominal: Soft. Bowel sounds are normal. There is no tenderness.  Musculoskeletal: Normal range of motion.  Neurological: He is alert and oriented to person, place, and time. He has normal reflexes. No cranial nerve deficit.  Skin: Skin is warm and dry.  Psychiatric: He has a normal mood and affect.          Assessment & Plan:

## 2018-01-25 ENCOUNTER — Telehealth: Payer: Self-pay | Admitting: Primary Care

## 2018-01-25 DIAGNOSIS — J4520 Mild intermittent asthma, uncomplicated: Secondary | ICD-10-CM

## 2018-01-25 NOTE — Telephone Encounter (Signed)
Copied from [place]. Topic: Quick Communication - Rx Refill/Question >> Jan 25, 2018  4:21 PM Robina Ade, Helene Kelp D wrote: Medication:VENTOLIN HFA 108 (90 Base) MCG/ACT inhaler   Has the patient contacted their pharmacy? Yes   (Agent: If no, request that the patient contact the pharmacy for the refill.)   Preferred Pharmacy (with phone number or street name): [id number] - [place], [place] [address] [place]   Agent: Please be advised that RX refills may take up to 3 business days. We ask that you follow-up with your pharmacy.

## 2018-01-26 MED ORDER — ALBUTEROL SULFATE HFA 108 (90 BASE) MCG/ACT IN AERS: INHALATION_SPRAY | RESPIRATORY_TRACT | 0 refills | Status: DC

## 2018-01-26 NOTE — Telephone Encounter (Signed)
Ventolin refill Last OV: 01/11/18 Last Refill:12/12/17 Pharmacy:[id number]

## 2018-02-22 ENCOUNTER — Other Ambulatory Visit: Payer: Self-pay | Admitting: Primary Care

## 2018-02-22 DIAGNOSIS — M9905 Segmental and somatic dysfunction of pelvic region: Secondary | ICD-10-CM | POA: Diagnosis not present

## 2018-02-22 DIAGNOSIS — M7071 Other bursitis of hip, right hip: Secondary | ICD-10-CM | POA: Diagnosis not present

## 2018-02-22 DIAGNOSIS — J4520 Mild intermittent asthma, uncomplicated: Secondary | ICD-10-CM

## 2018-02-22 DIAGNOSIS — M9902 Segmental and somatic dysfunction of thoracic region: Secondary | ICD-10-CM | POA: Diagnosis not present

## 2018-02-22 DIAGNOSIS — M9903 Segmental and somatic dysfunction of lumbar region: Secondary | ICD-10-CM | POA: Diagnosis not present

## 2018-04-07 DIAGNOSIS — X32XXXD Exposure to sunlight, subsequent encounter: Secondary | ICD-10-CM | POA: Diagnosis not present

## 2018-04-07 DIAGNOSIS — L570 Actinic keratosis: Secondary | ICD-10-CM | POA: Diagnosis not present

## 2018-04-07 DIAGNOSIS — C44622 Squamous cell carcinoma of skin of right upper limb, including shoulder: Secondary | ICD-10-CM | POA: Diagnosis not present

## 2018-04-07 DIAGNOSIS — B078 Other viral warts: Secondary | ICD-10-CM | POA: Diagnosis not present

## 2018-04-11 DIAGNOSIS — Z85828 Personal history of other malignant neoplasm of skin: Secondary | ICD-10-CM | POA: Diagnosis not present

## 2018-04-11 DIAGNOSIS — Z08 Encounter for follow-up examination after completed treatment for malignant neoplasm: Secondary | ICD-10-CM | POA: Diagnosis not present

## 2018-05-22 ENCOUNTER — Other Ambulatory Visit: Payer: Self-pay | Admitting: Primary Care

## 2018-05-22 DIAGNOSIS — J4520 Mild intermittent asthma, uncomplicated: Secondary | ICD-10-CM

## 2018-06-15 ENCOUNTER — Other Ambulatory Visit: Payer: Self-pay | Admitting: Primary Care

## 2018-06-15 DIAGNOSIS — J4520 Mild intermittent asthma, uncomplicated: Secondary | ICD-10-CM

## 2018-06-16 ENCOUNTER — Other Ambulatory Visit: Payer: Self-pay | Admitting: Primary Care

## 2018-06-16 DIAGNOSIS — J4520 Mild intermittent asthma, uncomplicated: Secondary | ICD-10-CM

## 2018-06-16 NOTE — Telephone Encounter (Signed)
Copied from [place] [phone]. Topic: Quick Communication - Rx Refill/Question >> Jun 16, 2018  4:20 PM Burchel, Abbi R wrote: Medication: albuterol (PROVENTIL HFA;VENTOLIN HFA) 108 (90 Base) MCG/ACT inhaler   Preferred Pharmacy: [facility] [place], [place] [address] [place] [address] Phone: [phone] Fax: [phone] Not a 24 hour pharmacy; exact hours not known.    Pt was advised that RX refills may take up to 3 business days. Pt insisted that he needs this rx filled today, he is going out of town.  I explained that it will take more than 1hr to get his request processed.

## 2018-06-20 MED ORDER — ALBUTEROL SULFATE HFA 108 (90 BASE) MCG/ACT IN AERS: INHALATION_SPRAY | RESPIRATORY_TRACT | 0 refills | Status: DC

## 2018-06-20 NOTE — Telephone Encounter (Signed)
Albuterol 108 mcg/ACT refill request  LOV 01/11/18 with Allie Bossier  [facility] [phone]

## 2018-07-24 ENCOUNTER — Other Ambulatory Visit: Payer: Self-pay | Admitting: Primary Care

## 2018-07-24 DIAGNOSIS — J4520 Mild intermittent asthma, uncomplicated: Secondary | ICD-10-CM

## 2018-07-25 NOTE — Telephone Encounter (Signed)
Electronic refill request Last office visit 01/11/18 Last refill 06/20/18 18 G

## 2018-07-25 NOTE — Telephone Encounter (Signed)
Please notify patient that as discussed before there are better inhalers to use on a daily basis besides albuterol. I do recommend he consider a different (long acting inhaler) for his asthma symptoms as it's not best practice to use albuterol daily. I sent a refill of his albuterol to his pharmacy. Will he think about this? It's for his overall well being. I can send a prescription for the long acting inhaler if he is agreeable.

## 2018-07-25 NOTE — Telephone Encounter (Signed)
Patient calling to check the status of this refill. States that he would like to know if K. Clark could add refills to the prescription. States that he has to go through this refill process every month. Informed patient that with the request being sent to the office yesterday, our refill policy allows 91-79 hours before getting a refill sent. Advised him that I would send a message and inform K. Carlis Abbott that he was calling about this. Please advise.

## 2018-07-27 NOTE — Telephone Encounter (Signed)
Copied from [place] [id number]. Topic: Quick Communication - Office Called Patient >> Jul 27, 2018  1:32 PM Ivar Drape wrote: Reason for CRM:   Vallarie Mare, the patient returned your call.  He said you can call him back on his cell phone.

## 2018-07-27 NOTE — Telephone Encounter (Signed)
Message left for patient to return my call.  

## 2018-07-28 NOTE — Telephone Encounter (Signed)
Pt called back. °

## 2018-07-28 NOTE — Telephone Encounter (Signed)
Message left for patient to return my call.  

## 2018-08-23 ENCOUNTER — Other Ambulatory Visit: Payer: Self-pay | Admitting: Primary Care

## 2018-08-23 DIAGNOSIS — J4520 Mild intermittent asthma, uncomplicated: Secondary | ICD-10-CM

## 2018-09-21 ENCOUNTER — Other Ambulatory Visit: Payer: Self-pay | Admitting: Primary Care

## 2018-09-21 DIAGNOSIS — J4520 Mild intermittent asthma, uncomplicated: Secondary | ICD-10-CM

## 2018-09-22 NOTE — Telephone Encounter (Signed)
Pt states he will be leaving today at 4pm to go out of town and calling to check on the med refill. CB#: [phone]

## 2018-09-22 NOTE — Telephone Encounter (Signed)
Copied from [place] [id number]. Topic: General - Other >> Sep 22, 2018 11:13 AM Yvette Rack wrote: Reason for CRM: Pt states he is going out of town and just needs the Rx for albuterol (PROVENTIL HFA;VENTOLIN HFA) 108 (90 Base) MCG/ACT inhaler approved so he can pick it up at [place], [place], [place].

## 2018-09-25 NOTE — Telephone Encounter (Signed)
This was done on 09/22/2018. Patient was notified.

## 2018-09-26 DIAGNOSIS — Z85828 Personal history of other malignant neoplasm of skin: Secondary | ICD-10-CM | POA: Diagnosis not present

## 2018-09-26 DIAGNOSIS — X32XXXD Exposure to sunlight, subsequent encounter: Secondary | ICD-10-CM | POA: Diagnosis not present

## 2018-09-26 DIAGNOSIS — D225 Melanocytic nevi of trunk: Secondary | ICD-10-CM | POA: Diagnosis not present

## 2018-09-26 DIAGNOSIS — Z08 Encounter for follow-up examination after completed treatment for malignant neoplasm: Secondary | ICD-10-CM | POA: Diagnosis not present

## 2018-09-26 DIAGNOSIS — L570 Actinic keratosis: Secondary | ICD-10-CM | POA: Diagnosis not present

## 2018-09-26 DIAGNOSIS — L821 Other seborrheic keratosis: Secondary | ICD-10-CM | POA: Diagnosis not present

## 2018-09-26 DIAGNOSIS — C44311 Basal cell carcinoma of skin of nose: Secondary | ICD-10-CM | POA: Diagnosis not present

## 2018-10-04 DIAGNOSIS — M25561 Pain in right knee: Secondary | ICD-10-CM | POA: Diagnosis not present

## 2018-10-20 ENCOUNTER — Other Ambulatory Visit: Payer: Self-pay | Admitting: Primary Care

## 2018-10-20 ENCOUNTER — Telehealth: Payer: Self-pay | Admitting: Primary Care

## 2018-10-20 DIAGNOSIS — J4520 Mild intermittent asthma, uncomplicated: Secondary | ICD-10-CM

## 2018-10-20 NOTE — Telephone Encounter (Signed)
Pt called to set up 2020 annual (set up 01/12/19) and request albuterol for more than 1 mo at a time. He said he has to call every month to request a refill and he would like to simplify.

## 2018-10-20 NOTE — Telephone Encounter (Signed)
Per Jeani Hawking.Marland KitchenMarland KitchenPatient called to set up 2020 annual (set up 01/12/19) and request albuterol for more than 1 mo at a time. He said he has to call every month to request a refill and he would like to simplify.  Please advise

## 2018-10-20 NOTE — Telephone Encounter (Signed)
This has been address in the refill encounter.

## 2018-10-23 NOTE — Telephone Encounter (Signed)
Noted, refill sent to pharmacy. Will discuss again at upcoming CPE in February 2020.

## 2018-10-31 DIAGNOSIS — Z08 Encounter for follow-up examination after completed treatment for malignant neoplasm: Secondary | ICD-10-CM | POA: Diagnosis not present

## 2018-10-31 DIAGNOSIS — L570 Actinic keratosis: Secondary | ICD-10-CM | POA: Diagnosis not present

## 2018-10-31 DIAGNOSIS — Z85828 Personal history of other malignant neoplasm of skin: Secondary | ICD-10-CM | POA: Diagnosis not present

## 2018-10-31 DIAGNOSIS — X32XXXD Exposure to sunlight, subsequent encounter: Secondary | ICD-10-CM | POA: Diagnosis not present

## 2018-11-13 DIAGNOSIS — Z961 Presence of intraocular lens: Secondary | ICD-10-CM | POA: Diagnosis not present

## 2019-01-01 ENCOUNTER — Other Ambulatory Visit: Payer: Self-pay | Admitting: Primary Care

## 2019-01-01 DIAGNOSIS — E785 Hyperlipidemia, unspecified: Secondary | ICD-10-CM

## 2019-01-03 ENCOUNTER — Other Ambulatory Visit: Payer: Self-pay | Admitting: Primary Care

## 2019-01-03 DIAGNOSIS — J4520 Mild intermittent asthma, uncomplicated: Secondary | ICD-10-CM

## 2019-01-03 NOTE — Telephone Encounter (Signed)
Noted.  We will send 1 refill to his pharmacy for now, we will discuss his recurrent use of albuterol during his upcoming appointment.

## 2019-01-03 NOTE — Telephone Encounter (Signed)
Last prescribed on 10/11/2018. Last office visit on 01/11/2018. Next future appointment on 01/12/2019

## 2019-01-09 ENCOUNTER — Other Ambulatory Visit: Payer: BLUE CROSS/BLUE SHIELD

## 2019-01-11 ENCOUNTER — Other Ambulatory Visit (INDEPENDENT_AMBULATORY_CARE_PROVIDER_SITE_OTHER): Payer: BLUE CROSS/BLUE SHIELD

## 2019-01-11 DIAGNOSIS — E785 Hyperlipidemia, unspecified: Secondary | ICD-10-CM | POA: Diagnosis not present

## 2019-01-11 LAB — COMPREHENSIVE METABOLIC PANEL
ALT: 11 U/L (ref 0–53)
AST: 11 U/L (ref 0–37)
Albumin: 3.9 g/dL (ref 3.5–5.2)
Alkaline Phosphatase: 54 U/L (ref 39–117)
BUN: 20 mg/dL (ref 6–23)
CO2: 30 mEq/L (ref 19–32)
CREATININE: 1.18 mg/dL (ref 0.40–1.50)
Calcium: 8.6 mg/dL (ref 8.4–10.5)
Chloride: 105 mEq/L (ref 96–112)
GFR: 60.46 mL/min (ref >60.00)
GLUCOSE: 93 mg/dL (ref 70–99)
Potassium: 4.2 mEq/L (ref 3.5–5.1)
SODIUM: 141 meq/L (ref 135–145)
Total Bilirubin: 0.7 mg/dL (ref 0.2–1.2)
Total Protein: 6.3 g/dL (ref 6.0–8.3)

## 2019-01-11 LAB — LIPID PANEL
Cholesterol: 215 mg/dL — ABNORMAL HIGH (ref 0–200)
HDL: 63.7 mg/dL (ref >39.00)
LDL Cholesterol: 141 mg/dL — ABNORMAL HIGH (ref 0–99)
NONHDL: 151.38
Total CHOL/HDL Ratio: 3
Triglycerides: 51 mg/dL (ref 0.0–149.0)
VLDL: 10.2 mg/dL (ref 0.0–40.0)

## 2019-01-12 ENCOUNTER — Encounter: Payer: Self-pay | Admitting: Primary Care

## 2019-01-12 ENCOUNTER — Ambulatory Visit (INDEPENDENT_AMBULATORY_CARE_PROVIDER_SITE_OTHER): Payer: BLUE CROSS/BLUE SHIELD | Admitting: Primary Care

## 2019-01-12 VITALS — BP 136/78 | HR 65 | Temp 97.8°F | Ht 74.5 in | Wt 207.0 lb

## 2019-01-12 DIAGNOSIS — J4520 Mild intermittent asthma, uncomplicated: Secondary | ICD-10-CM

## 2019-01-12 DIAGNOSIS — Z0000 Encounter for general adult medical examination without abnormal findings: Secondary | ICD-10-CM

## 2019-01-12 DIAGNOSIS — Z23 Encounter for immunization: Secondary | ICD-10-CM | POA: Diagnosis not present

## 2019-01-12 MED ORDER — ZOSTER VAC RECOMB ADJUVANTED 50 MCG/0.5ML IM SUSR: 0.5000 mL | Freq: Once | INTRAMUSCULAR | 1 refills | Status: AC

## 2019-01-12 MED ORDER — ALBUTEROL SULFATE HFA 108 (90 BASE) MCG/ACT IN AERS: INHALATION_SPRAY | RESPIRATORY_TRACT | 5 refills | Status: DC

## 2019-01-12 NOTE — Assessment & Plan Note (Signed)
Endorses tetanus is UTD. Declines pneumonia and influenza vaccinations. Rx for shingrix provided. Colonoscopy UTD, due in 2024. PSA UTD. Commended him on regular exercise, and healthy diet, encouraged to continue. Exam unremarkable. Labs reviewed. Follow up in 1 year for CPE.

## 2019-01-12 NOTE — Assessment & Plan Note (Signed)
Continues to use his albuterol before or during exercise most everyday with improvement. Discussed potential long term/adverse effects of SABA use and that he may be better treated with daily ICS as well. He kindly declines and will continue with daily SABA use. No wheezing on exam. Refills sent to pharmacy.

## 2019-01-12 NOTE — Progress Notes (Signed)
Subjective:    Patient ID: KHALEB BROZ, male    DOB: 11-02-1946, 73 y.o.   MRN: [id number]  HPI  Mr. Jay is a 73 year old male who presents today for complete physical.  Immunizations: -Tetanus: Completed within 10 years  -Influenza: Declines  -Pneumonia: Declines  -Shingles: Never completed  Diet: He endorses a healthy diet Breakfast: Eggs, sausage, bacon  Lunch: Salad, sandwich  Dinner: Meat, vegetable, little starch Snacks: Occasionally  Desserts: 2-3 times weekly  Beverages: Unsweet tea, water, soda, beer  Exercise: He is exercising 3-4 days weekly, walks a lot Eye exam: Completed in 2019 Dental exam: Completes semi-annually  Colonoscopy: Completed in 2014, due in 2024 PSA: 1.08 in 2018 Hep C Screen: Negative in 2018  BP Readings from Last 3 Encounters:  01/12/19 136/78  01/11/18 124/80  10/21/17 132/80   Wt Readings from Last 3 Encounters:  01/12/19 207 lb (93.9 kg)  01/11/18 209 lb (94.8 kg)  10/21/17 204 lb 14.4 oz (92.9 kg)    The 10-year ASCVD risk score Mikey Bussing DC Jr., et al., 2013) is: 22.7%   Values used to calculate the score:     Age: 38 years     Sex: Male     Is Non-Hispanic African American: No     Diabetic: No     Tobacco smoker: No     Systolic Blood Pressure: 315 mmHg     Is BP treated: No     HDL Cholesterol: 63.7 mg/dL     Total Cholesterol: 215 mg/dL   Review of Systems  Constitutional: Negative for unexpected weight change.  HENT: Negative for rhinorrhea.   Respiratory: Negative for cough and shortness of breath.   Cardiovascular: Negative for chest pain.  Gastrointestinal: Negative for constipation and diarrhea.  Genitourinary: Negative for difficulty urinating.  Musculoskeletal: Negative for arthralgias and myalgias.  Skin: Negative for rash.  Allergic/Immunologic: Negative for environmental allergies.  Neurological: Negative for dizziness, numbness and headaches.  Psychiatric/Behavioral: The patient is not  nervous/anxious.        Past Medical History:  Diagnosis Date  . Asthma   . Diverticulosis   . Inguinal hernia      Social History   Socioeconomic History  . Marital status: Married    Spouse name: Not on file  . Number of children: 2  . Years of education: Not on file  . Highest education level: Not on file  Occupational History  . Occupation: Orthoptist  Social Needs  . Financial resource strain: Not on file  . Food insecurity:    Worry: Not on file    Inability: Not on file  . Transportation needs:    Medical: Not on file    Non-medical: Not on file  Tobacco Use  . Smoking status: Never Smoker  . Smokeless tobacco: Never Used  Substance and Sexual Activity  . Alcohol use: Yes    Alcohol/week: 0.0 standard drinks    Comment: 2 per week  . Drug use: No  . Sexual activity: Yes  Lifestyle  . Physical activity:    Days per week: Not on file    Minutes per session: Not on file  . Stress: Not on file  Relationships  . Social connections:    Talks on phone: Not on file    Gets together: Not on file    Attends religious service: Not on file    Active member of club or organization: Not on file    Attends  meetings of clubs or organizations: Not on file    Relationship status: Not on file  . Intimate partner violence:    Fear of current or ex partner: Not on file    Emotionally abused: Not on file    Physically abused: Not on file    Forced sexual activity: Not on file  Other Topics Concern  . Not on file  Social History Narrative   Married.   2 children. 3 grandchildren.   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     Past Surgical History:  Procedure Laterality Date  . INGUINAL HERNIA REPAIR  2012,2015    Family History  Problem Relation Age of Onset  . Cancer Mother        breast  . Heart disease Father   . Lymphoma Sister   . Colon cancer Unknown 72       niece     Allergies  Allergen Reactions  . Penicillin G     Other reaction(s): Unknown  . Penicillins     As a child unsure of reaction    Current Outpatient Medications on File Prior to Visit  Medication Sig Dispense Refill  . albuterol (PROVENTIL HFA;VENTOLIN HFA) 108 (90 Base) MCG/ACT inhaler INHALE 1-2 PUFFS BY MOUTH EVERY 6 HOURS AS NEEDED 18 g 0   No current facility-administered medications on file prior to visit.     BP 136/78 (BP Location: Right Arm, Patient Position: Sitting, Cuff Size: Normal)   Pulse 65   Temp 97.8 F (36.6 C) (Oral)   Ht 6' 2.5" (1.892 m)   Wt 207 lb (93.9 kg)   SpO2 95%   BMI 26.22 kg/m    Objective:   Physical Exam  Constitutional: He is oriented to person, place, and time. He appears well-nourished.  HENT:  Mouth/Throat: No oropharyngeal exudate.  Eyes: Pupils are equal, round, and reactive to light. EOM are normal.  Neck: Neck supple. No thyromegaly present.  Cardiovascular: Normal rate and regular rhythm.  Respiratory: Effort normal and breath sounds normal. He has no wheezes.  GI: Soft. Bowel sounds are normal. There is no abdominal tenderness.  Musculoskeletal: Normal range of motion.  Neurological: He is alert and oriented to person, place, and time.  Skin: Skin is warm and dry.  Psychiatric: He has a normal mood and affect.           Assessment & Plan:

## 2019-01-12 NOTE — Patient Instructions (Addendum)
Continue exercising. You should be getting 150 minutes of moderate intensity exercise weekly.  Continue to eat a healthy diet.  Try to use the albuterol inhaler only when needed for exercise.   Take the Shingrix prescription to the pharmacy.  We will see you next year or sooner if needed.  It was a pleasure to see you today!   Preventive Care 74 Years and Older, Male Preventive care refers to lifestyle choices and visits with your health care provider that can promote health and wellness. What does preventive care include?   A yearly physical exam. This is also called an annual well check.  Dental exams once or twice a year.  Routine eye exams. Ask your health care provider how often you should have your eyes checked.  Personal lifestyle choices, including: ? Daily care of your teeth and gums. ? Regular physical activity. ? Eating a healthy diet. ? Avoiding tobacco and drug use. ? Limiting alcohol use. ? Practicing safe sex. ? Taking low doses of aspirin every day. ? Taking vitamin and mineral supplements as recommended by your health care provider. What happens during an annual well check? The services and screenings done by your health care provider during your annual well check will depend on your age, overall health, lifestyle risk factors, and family history of disease. Counseling Your health care provider may ask you questions about your:  Alcohol use.  Tobacco use.  Drug use.  Emotional well-being.  Home and relationship well-being.  Sexual activity.  Eating habits.  History of falls.  Memory and ability to understand (cognition).  Work and work Statistician. Screening You may have the following tests or measurements:  Height, weight, and BMI.  Blood pressure.  Lipid and cholesterol levels. These may be checked every 5 years, or more frequently if you are over 40 years old.  Skin check.  Lung cancer screening. You may have this screening every  year starting at age 62 if you have a 30-pack-year history of smoking and currently smoke or have quit within the past 15 years.  Colorectal cancer screening. All adults should have this screening starting at age 96 and continuing until age 24. You will have tests every 1-10 years, depending on your results and the type of screening test. People at increased risk should start screening at an earlier age. Screening tests may include: ? Guaiac-based fecal occult blood testing. ? Fecal immunochemical test (FIT). ? Stool DNA test. ? Virtual colonoscopy. ? Sigmoidoscopy. During this test, a flexible tube with a tiny camera (sigmoidoscope) is used to examine your rectum and lower colon. The sigmoidoscope is inserted through your anus into your rectum and lower colon. ? Colonoscopy. During this test, a long, thin, flexible tube with a tiny camera (colonoscope) is used to examine your entire colon and rectum.  Prostate cancer screening. Recommendations will vary depending on your family history and other risks.  Hepatitis C blood test.  Hepatitis B blood test.  Sexually transmitted disease (STD) testing.  Diabetes screening. This is done by checking your blood sugar (glucose) after you have not eaten for a while (fasting). You may have this done every 1-3 years.  Abdominal aortic aneurysm (AAA) screening. You may need this if you are a current or former smoker.  Osteoporosis. You may be screened starting at age 13 if you are at high risk. Talk with your health care provider about your test results, treatment options, and if necessary, the need for more tests. Vaccines Your health care  provider may recommend certain vaccines, such as:  Influenza vaccine. This is recommended every year.  Tetanus, diphtheria, and acellular pertussis (Tdap, Td) vaccine. You may need a Td booster every 10 years.  Varicella vaccine. You may need this if you have not been vaccinated.  Zoster vaccine. You may need  this after age 67.  Measles, mumps, and rubella (MMR) vaccine. You may need at least one dose of MMR if you were born in 1957 or later. You may also need a second dose.  Pneumococcal 13-valent conjugate (PCV13) vaccine. One dose is recommended after age 85.  Pneumococcal polysaccharide (PPSV23) vaccine. One dose is recommended after age 45.  Meningococcal vaccine. You may need this if you have certain conditions.  Hepatitis A vaccine. You may need this if you have certain conditions or if you travel or work in places where you may be exposed to hepatitis A.  Hepatitis B vaccine. You may need this if you have certain conditions or if you travel or work in places where you may be exposed to hepatitis B.  Haemophilus influenzae type b (Hib) vaccine. You may need this if you have certain risk factors. Talk to your health care provider about which screenings and vaccines you need and how often you need them. This information is not intended to replace advice given to you by your health care provider. Make sure you discuss any questions you have with your health care provider. Document Released: 12/12/2015 Document Revised: 01/05/2018 Document Reviewed: 09/16/2015 Elsevier Interactive Patient Education  2019 [employer].

## 2019-04-10 DIAGNOSIS — C4441 Basal cell carcinoma of skin of scalp and neck: Secondary | ICD-10-CM | POA: Diagnosis not present

## 2019-05-25 DIAGNOSIS — Z85828 Personal history of other malignant neoplasm of skin: Secondary | ICD-10-CM | POA: Diagnosis not present

## 2019-05-25 DIAGNOSIS — Z08 Encounter for follow-up examination after completed treatment for malignant neoplasm: Secondary | ICD-10-CM | POA: Diagnosis not present

## 2019-05-25 DIAGNOSIS — C4441 Basal cell carcinoma of skin of scalp and neck: Secondary | ICD-10-CM | POA: Diagnosis not present

## 2019-05-25 DIAGNOSIS — X32XXXD Exposure to sunlight, subsequent encounter: Secondary | ICD-10-CM | POA: Diagnosis not present

## 2019-05-25 DIAGNOSIS — L570 Actinic keratosis: Secondary | ICD-10-CM | POA: Diagnosis not present

## 2019-07-08 ENCOUNTER — Other Ambulatory Visit: Payer: Self-pay | Admitting: Primary Care

## 2019-07-08 DIAGNOSIS — J4520 Mild intermittent asthma, uncomplicated: Secondary | ICD-10-CM

## 2019-09-14 DIAGNOSIS — M1711 Unilateral primary osteoarthritis, right knee: Secondary | ICD-10-CM | POA: Diagnosis not present

## 2019-09-14 DIAGNOSIS — R600 Localized edema: Secondary | ICD-10-CM | POA: Diagnosis not present

## 2019-09-14 DIAGNOSIS — J4520 Mild intermittent asthma, uncomplicated: Secondary | ICD-10-CM | POA: Diagnosis not present

## 2019-09-24 DIAGNOSIS — M1711 Unilateral primary osteoarthritis, right knee: Secondary | ICD-10-CM | POA: Diagnosis not present

## 2019-09-24 DIAGNOSIS — Z01818 Encounter for other preprocedural examination: Secondary | ICD-10-CM | POA: Diagnosis not present

## 2019-09-24 DIAGNOSIS — I491 Atrial premature depolarization: Secondary | ICD-10-CM | POA: Diagnosis not present

## 2019-09-25 DIAGNOSIS — X32XXXD Exposure to sunlight, subsequent encounter: Secondary | ICD-10-CM | POA: Diagnosis not present

## 2019-09-25 DIAGNOSIS — Z85828 Personal history of other malignant neoplasm of skin: Secondary | ICD-10-CM | POA: Diagnosis not present

## 2019-09-25 DIAGNOSIS — L570 Actinic keratosis: Secondary | ICD-10-CM | POA: Diagnosis not present

## 2019-09-25 DIAGNOSIS — Z08 Encounter for follow-up examination after completed treatment for malignant neoplasm: Secondary | ICD-10-CM | POA: Diagnosis not present

## 2019-09-30 HISTORY — PX: REPLACEMENT TOTAL KNEE: SUR1224

## 2019-10-07 DIAGNOSIS — G8929 Other chronic pain: Secondary | ICD-10-CM | POA: Diagnosis not present

## 2019-10-07 DIAGNOSIS — G8918 Other acute postprocedural pain: Secondary | ICD-10-CM | POA: Diagnosis not present

## 2019-10-07 DIAGNOSIS — M21261 Flexion deformity, right knee: Secondary | ICD-10-CM | POA: Diagnosis not present

## 2019-10-07 DIAGNOSIS — M23611 Other spontaneous disruption of anterior cruciate ligament of right knee: Secondary | ICD-10-CM | POA: Diagnosis not present

## 2019-10-07 DIAGNOSIS — M1711 Unilateral primary osteoarthritis, right knee: Secondary | ICD-10-CM | POA: Diagnosis not present

## 2019-10-07 DIAGNOSIS — Z20828 Contact with and (suspected) exposure to other viral communicable diseases: Secondary | ICD-10-CM | POA: Diagnosis not present

## 2019-10-07 DIAGNOSIS — M21061 Valgus deformity, not elsewhere classified, right knee: Secondary | ICD-10-CM | POA: Diagnosis not present

## 2019-10-07 DIAGNOSIS — J4520 Mild intermittent asthma, uncomplicated: Secondary | ICD-10-CM | POA: Diagnosis not present

## 2019-10-17 DIAGNOSIS — R262 Difficulty in walking, not elsewhere classified: Secondary | ICD-10-CM | POA: Diagnosis not present

## 2019-10-17 DIAGNOSIS — M6281 Muscle weakness (generalized): Secondary | ICD-10-CM | POA: Diagnosis not present

## 2019-10-17 DIAGNOSIS — M25661 Stiffness of right knee, not elsewhere classified: Secondary | ICD-10-CM | POA: Diagnosis not present

## 2019-10-17 DIAGNOSIS — M25561 Pain in right knee: Secondary | ICD-10-CM | POA: Diagnosis not present

## 2019-10-23 DIAGNOSIS — Z96651 Presence of right artificial knee joint: Secondary | ICD-10-CM | POA: Diagnosis not present

## 2019-10-23 DIAGNOSIS — M25661 Stiffness of right knee, not elsewhere classified: Secondary | ICD-10-CM | POA: Diagnosis not present

## 2019-10-23 DIAGNOSIS — R262 Difficulty in walking, not elsewhere classified: Secondary | ICD-10-CM | POA: Diagnosis not present

## 2019-10-23 DIAGNOSIS — M6281 Muscle weakness (generalized): Secondary | ICD-10-CM | POA: Diagnosis not present

## 2019-10-23 DIAGNOSIS — M25561 Pain in right knee: Secondary | ICD-10-CM | POA: Diagnosis not present

## 2019-10-24 DIAGNOSIS — M25561 Pain in right knee: Secondary | ICD-10-CM | POA: Diagnosis not present

## 2019-10-24 DIAGNOSIS — M25661 Stiffness of right knee, not elsewhere classified: Secondary | ICD-10-CM | POA: Diagnosis not present

## 2019-10-24 DIAGNOSIS — R262 Difficulty in walking, not elsewhere classified: Secondary | ICD-10-CM | POA: Diagnosis not present

## 2019-10-24 DIAGNOSIS — M6281 Muscle weakness (generalized): Secondary | ICD-10-CM | POA: Diagnosis not present

## 2019-10-29 DIAGNOSIS — M6281 Muscle weakness (generalized): Secondary | ICD-10-CM | POA: Diagnosis not present

## 2019-10-29 DIAGNOSIS — M25661 Stiffness of right knee, not elsewhere classified: Secondary | ICD-10-CM | POA: Diagnosis not present

## 2019-10-29 DIAGNOSIS — M25561 Pain in right knee: Secondary | ICD-10-CM | POA: Diagnosis not present

## 2019-10-29 DIAGNOSIS — R262 Difficulty in walking, not elsewhere classified: Secondary | ICD-10-CM | POA: Diagnosis not present

## 2019-11-01 DIAGNOSIS — M25661 Stiffness of right knee, not elsewhere classified: Secondary | ICD-10-CM | POA: Diagnosis not present

## 2019-11-01 DIAGNOSIS — R262 Difficulty in walking, not elsewhere classified: Secondary | ICD-10-CM | POA: Diagnosis not present

## 2019-11-01 DIAGNOSIS — M6281 Muscle weakness (generalized): Secondary | ICD-10-CM | POA: Diagnosis not present

## 2019-11-01 DIAGNOSIS — M25561 Pain in right knee: Secondary | ICD-10-CM | POA: Diagnosis not present

## 2019-11-05 DIAGNOSIS — M25561 Pain in right knee: Secondary | ICD-10-CM | POA: Diagnosis not present

## 2019-11-05 DIAGNOSIS — R262 Difficulty in walking, not elsewhere classified: Secondary | ICD-10-CM | POA: Diagnosis not present

## 2019-11-05 DIAGNOSIS — M6281 Muscle weakness (generalized): Secondary | ICD-10-CM | POA: Diagnosis not present

## 2019-11-05 DIAGNOSIS — M25661 Stiffness of right knee, not elsewhere classified: Secondary | ICD-10-CM | POA: Diagnosis not present

## 2019-11-08 DIAGNOSIS — M6281 Muscle weakness (generalized): Secondary | ICD-10-CM | POA: Diagnosis not present

## 2019-11-08 DIAGNOSIS — M25561 Pain in right knee: Secondary | ICD-10-CM | POA: Diagnosis not present

## 2019-11-08 DIAGNOSIS — R262 Difficulty in walking, not elsewhere classified: Secondary | ICD-10-CM | POA: Diagnosis not present

## 2019-11-08 DIAGNOSIS — M25661 Stiffness of right knee, not elsewhere classified: Secondary | ICD-10-CM | POA: Diagnosis not present

## 2019-11-12 DIAGNOSIS — M25561 Pain in right knee: Secondary | ICD-10-CM | POA: Diagnosis not present

## 2019-11-12 DIAGNOSIS — M25661 Stiffness of right knee, not elsewhere classified: Secondary | ICD-10-CM | POA: Diagnosis not present

## 2019-11-12 DIAGNOSIS — R262 Difficulty in walking, not elsewhere classified: Secondary | ICD-10-CM | POA: Diagnosis not present

## 2019-11-12 DIAGNOSIS — M6281 Muscle weakness (generalized): Secondary | ICD-10-CM | POA: Diagnosis not present

## 2019-11-14 ENCOUNTER — Other Ambulatory Visit: Payer: BLUE CROSS/BLUE SHIELD

## 2019-11-14 DIAGNOSIS — Z20828 Contact with and (suspected) exposure to other viral communicable diseases: Secondary | ICD-10-CM | POA: Diagnosis not present

## 2019-11-26 DIAGNOSIS — M6281 Muscle weakness (generalized): Secondary | ICD-10-CM | POA: Diagnosis not present

## 2019-11-26 DIAGNOSIS — M25661 Stiffness of right knee, not elsewhere classified: Secondary | ICD-10-CM | POA: Diagnosis not present

## 2019-11-26 DIAGNOSIS — R262 Difficulty in walking, not elsewhere classified: Secondary | ICD-10-CM | POA: Diagnosis not present

## 2019-11-26 DIAGNOSIS — M25561 Pain in right knee: Secondary | ICD-10-CM | POA: Diagnosis not present

## 2019-12-03 DIAGNOSIS — R262 Difficulty in walking, not elsewhere classified: Secondary | ICD-10-CM | POA: Diagnosis not present

## 2019-12-03 DIAGNOSIS — M25661 Stiffness of right knee, not elsewhere classified: Secondary | ICD-10-CM | POA: Diagnosis not present

## 2019-12-03 DIAGNOSIS — M6281 Muscle weakness (generalized): Secondary | ICD-10-CM | POA: Diagnosis not present

## 2019-12-03 DIAGNOSIS — M25561 Pain in right knee: Secondary | ICD-10-CM | POA: Diagnosis not present

## 2019-12-06 DIAGNOSIS — M25661 Stiffness of right knee, not elsewhere classified: Secondary | ICD-10-CM | POA: Diagnosis not present

## 2019-12-06 DIAGNOSIS — M6281 Muscle weakness (generalized): Secondary | ICD-10-CM | POA: Diagnosis not present

## 2019-12-06 DIAGNOSIS — M25561 Pain in right knee: Secondary | ICD-10-CM | POA: Diagnosis not present

## 2019-12-06 DIAGNOSIS — R262 Difficulty in walking, not elsewhere classified: Secondary | ICD-10-CM | POA: Diagnosis not present

## 2019-12-10 DIAGNOSIS — M6281 Muscle weakness (generalized): Secondary | ICD-10-CM | POA: Diagnosis not present

## 2019-12-10 DIAGNOSIS — M25561 Pain in right knee: Secondary | ICD-10-CM | POA: Diagnosis not present

## 2019-12-10 DIAGNOSIS — M25661 Stiffness of right knee, not elsewhere classified: Secondary | ICD-10-CM | POA: Diagnosis not present

## 2019-12-10 DIAGNOSIS — R262 Difficulty in walking, not elsewhere classified: Secondary | ICD-10-CM | POA: Diagnosis not present

## 2019-12-17 ENCOUNTER — Other Ambulatory Visit: Payer: Self-pay

## 2019-12-17 DIAGNOSIS — M25561 Pain in right knee: Secondary | ICD-10-CM | POA: Diagnosis not present

## 2019-12-17 DIAGNOSIS — J4520 Mild intermittent asthma, uncomplicated: Secondary | ICD-10-CM

## 2019-12-17 DIAGNOSIS — M25661 Stiffness of right knee, not elsewhere classified: Secondary | ICD-10-CM | POA: Diagnosis not present

## 2019-12-17 DIAGNOSIS — M6281 Muscle weakness (generalized): Secondary | ICD-10-CM | POA: Diagnosis not present

## 2019-12-17 DIAGNOSIS — R262 Difficulty in walking, not elsewhere classified: Secondary | ICD-10-CM | POA: Diagnosis not present

## 2019-12-17 MED ORDER — ALBUTEROL SULFATE HFA 108 (90 BASE) MCG/ACT IN AERS: INHALATION_SPRAY | RESPIRATORY_TRACT | 5 refills | Status: DC

## 2019-12-17 NOTE — Telephone Encounter (Signed)
Patient contacted the office and is needing a refill on his albuterol. This was last refilled on 07/09/19 with 5 refills. Patient is scheduled for CPE on 01/28/20. Anda Kraft, is this ok to refill?

## 2019-12-20 ENCOUNTER — Ambulatory Visit: Payer: BC Managed Care – PPO | Attending: Internal Medicine

## 2019-12-20 DIAGNOSIS — Z23 Encounter for immunization: Secondary | ICD-10-CM | POA: Insufficient documentation

## 2019-12-30 ENCOUNTER — Other Ambulatory Visit: Payer: Self-pay | Admitting: Primary Care

## 2019-12-30 DIAGNOSIS — Z125 Encounter for screening for malignant neoplasm of prostate: Secondary | ICD-10-CM

## 2019-12-30 DIAGNOSIS — E785 Hyperlipidemia, unspecified: Secondary | ICD-10-CM

## 2019-12-31 ENCOUNTER — Other Ambulatory Visit: Payer: BC Managed Care – PPO

## 2020-01-10 ENCOUNTER — Ambulatory Visit: Payer: BC Managed Care – PPO | Attending: Internal Medicine

## 2020-01-10 DIAGNOSIS — Z23 Encounter for immunization: Secondary | ICD-10-CM

## 2020-01-10 NOTE — Progress Notes (Signed)
   Covid-19 Vaccination Clinic  Name:  Stephen Patterson    MRN: [id number] DOB: 05/08/1946  01/10/2020  [name] was observed post Covid-19 immunization for 15 minutes without incidence. He was provided with Vaccine Information Sheet and instruction to access the V-Safe system.   [name] was instructed to call 911 with any severe reactions post vaccine: Marland Kitchen Difficulty breathing  . Swelling of your face and throat  . A fast heartbeat  . A bad rash all over your body  . Dizziness and weakness    Immunizations Administered    Name Date Dose VIS Date Route   Pfizer COVID-19 Vaccine 01/10/2020 11:21 AM 0.3 mL 11/09/2019 Intramuscular   Manufacturer: [place]   Lot: [id number]   [place]: [id number]

## 2020-01-18 DIAGNOSIS — Z961 Presence of intraocular lens: Secondary | ICD-10-CM | POA: Diagnosis not present

## 2020-01-18 DIAGNOSIS — H26493 Other secondary cataract, bilateral: Secondary | ICD-10-CM | POA: Diagnosis not present

## 2020-01-28 ENCOUNTER — Other Ambulatory Visit: Payer: BC Managed Care – PPO

## 2020-01-28 ENCOUNTER — Other Ambulatory Visit: Payer: Self-pay

## 2020-02-04 ENCOUNTER — Encounter: Payer: Self-pay | Admitting: Primary Care

## 2020-02-04 ENCOUNTER — Other Ambulatory Visit: Payer: Self-pay

## 2020-02-04 ENCOUNTER — Ambulatory Visit (INDEPENDENT_AMBULATORY_CARE_PROVIDER_SITE_OTHER): Payer: BC Managed Care – PPO | Admitting: Primary Care

## 2020-02-04 VITALS — BP 136/80 | HR 70 | Temp 96.6°F | Ht 74.5 in | Wt 209.2 lb

## 2020-02-04 DIAGNOSIS — Z23 Encounter for immunization: Secondary | ICD-10-CM | POA: Diagnosis not present

## 2020-02-04 DIAGNOSIS — Z125 Encounter for screening for malignant neoplasm of prostate: Secondary | ICD-10-CM | POA: Diagnosis not present

## 2020-02-04 DIAGNOSIS — E785 Hyperlipidemia, unspecified: Secondary | ICD-10-CM | POA: Diagnosis not present

## 2020-02-04 DIAGNOSIS — J4520 Mild intermittent asthma, uncomplicated: Secondary | ICD-10-CM

## 2020-02-04 DIAGNOSIS — Z0000 Encounter for general adult medical examination without abnormal findings: Secondary | ICD-10-CM

## 2020-02-04 LAB — COMPREHENSIVE METABOLIC PANEL
ALT: 12 U/L (ref 0–53)
AST: 13 U/L (ref 0–37)
Albumin: 3.9 g/dL (ref 3.5–5.2)
Alkaline Phosphatase: 64 U/L (ref 39–117)
BUN: 20 mg/dL (ref 6–23)
CO2: 32 mEq/L (ref 19–32)
Calcium: 9 mg/dL (ref 8.4–10.5)
Chloride: 107 mEq/L (ref 96–112)
Creatinine, Ser: 1.18 mg/dL (ref 0.40–1.50)
GFR: 60.28 mL/min (ref >60.00)
Glucose, Bld: 94 mg/dL (ref 70–99)
Potassium: 4.8 mEq/L (ref 3.5–5.1)
Sodium: 142 mEq/L (ref 135–145)
Total Bilirubin: 0.9 mg/dL (ref 0.2–1.2)
Total Protein: 6.3 g/dL (ref 6.0–8.3)

## 2020-02-04 LAB — CBC
HCT: 43.6 % (ref 39.0–52.0)
Hemoglobin: 14.8 g/dL (ref 13.0–17.0)
MCHC: 33.9 g/dL (ref 30.0–36.0)
MCV: 86.8 fl (ref 78.0–100.0)
Platelets: 157 10*3/uL (ref 150.0–400.0)
RBC: 5.02 Mil/uL (ref 4.22–5.81)
RDW: 13.1 % (ref 11.5–15.5)
WBC: 4.2 10*3/uL (ref 4.0–10.5)

## 2020-02-04 LAB — LIPID PANEL
Cholesterol: 211 mg/dL — ABNORMAL HIGH (ref 0–200)
HDL: 66 mg/dL (ref >39.00)
LDL Cholesterol: 136 mg/dL — ABNORMAL HIGH (ref 0–99)
NonHDL: 145.16
Total CHOL/HDL Ratio: 3
Triglycerides: 47 mg/dL (ref 0.0–149.0)
VLDL: 9.4 mg/dL (ref 0.0–40.0)

## 2020-02-04 LAB — PSA: PSA: 1.32 ng/mL (ref 0.10–4.00)

## 2020-02-04 NOTE — Assessment & Plan Note (Signed)
Pneumonia vaccination due, he declines. Shingrix due, he agrees and first dose given today.  PSA pending. Colonoscopy UTD, due in 2024. Encouraged a healthy diet, regular exercise.  Exam today unremarkable.  Labs pending.

## 2020-02-04 NOTE — Progress Notes (Signed)
Subjective:    Patient ID: Stephen Patterson, male    DOB: 07-21-1946, 74 y.o.   MRN: [id number]  HPI  This visit occurred during the SARS-CoV-2 public health emergency.  Safety protocols were in place, including screening questions prior to the visit, additional usage of staff PPE, and extensive cleaning of exam room while observing appropriate contact time as indicated for disinfecting solutions.   Stephen Patterson is a 74 year old male who presents today for complete physical.  Immunizations: -Tetanus: Unsure, declines  -Influenza: Completed this season  -Shingles: Never completed -Pneumonia: Never completed, declines  -Covid 68: Completed in late January 2021 and early February 2021  Diet: He endorses a healthy diet. Exercise: Exercises 5 days weekly  Eye exam: Completed in 2021 Dental exam: Completes semi-annually   Colonoscopy: Completed in 2014, due in 2024 PSA: Pending Hep C Screen: Negative  BP Readings from Last 3 Encounters:  02/04/20 136/80  01/12/19 136/78  01/11/18 124/80      Review of Systems  Constitutional: Negative for unexpected weight change.  HENT: Negative for rhinorrhea.   Eyes: Negative for visual disturbance.  Respiratory: Negative for cough and shortness of breath.        Using albuterol inhaler before exercise, exercises 5 days weekly.   Cardiovascular: Negative for chest pain.  Gastrointestinal: Negative for constipation and diarrhea.  Genitourinary: Negative for difficulty urinating.  Musculoskeletal: Negative for arthralgias.  Skin: Negative for rash.  Allergic/Immunologic: Negative for environmental allergies.  Neurological: Negative for dizziness and headaches.  Psychiatric/Behavioral: The patient is not nervous/anxious.        Past Medical History:  Diagnosis Date  . Asthma   . COVID-19 virus infection   . Diverticulosis   . Inguinal hernia   . Osteoarthritis of right knee      Social History   Socioeconomic History  . Marital  status: Married    Spouse name: Not on file  . Number of children: 2  . Years of education: Not on file  . Highest education level: Not on file  Occupational History  . Occupation: Orthoptist  Tobacco Use  . Smoking status: Never Smoker  . Smokeless tobacco: Never Used  Substance and Sexual Activity  . Alcohol use: Yes    Alcohol/week: 0.0 standard drinks    Comment: 2 per week  . Drug use: No  . Sexual activity: Yes  Other Topics Concern  . Not on file  Social History Narrative   Married.   2 children. 3 grandchildren.   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    Social Determinants of Health   Financial Resource Strain:   . Difficulty of Paying Living Expenses: Not on file  Food Insecurity:   . Worried About Charity fundraiser in the Last Year: Not on file  . Ran Out of Food in the Last Year: Not on file  Transportation Needs:   . Lack of Transportation (Medical): Not on file  . Lack of Transportation (Non-Medical): Not on file  Physical Activity:   . Days of Exercise per Week: Not on file  . Minutes of Exercise per Session: Not on file  Stress:   . Feeling of Stress : Not on file  Social Connections:   . Frequency of Communication with Friends and Family: Not on file  . Frequency of Social Gatherings with Friends and Family: Not  on file  . Attends Religious Services: Not on file  . Active Member of Clubs or Organizations: Not on file  . Attends Archivist Meetings: Not on file  . Marital Status: Not on file  Intimate Partner Violence:   . Fear of Current or Ex-Partner: Not on file  . Emotionally Abused: Not on file  . Physically Abused: Not on file  . Sexually Abused: Not on file    Past Surgical History:  Procedure Laterality Date  . INGUINAL HERNIA REPAIR  2012,2015  . REPLACEMENT TOTAL KNEE Right 09/2019    Family History  Problem Relation Age of Onset  .  Cancer Mother        breast  . Heart disease Father   . Lymphoma Sister   . Colon cancer Unknown 74       niece    Allergies  Allergen Reactions  . Penicillin G     Other reaction(s): Unknown  . Penicillins     As a child unsure of reaction    Current Outpatient Medications on File Prior to Visit  Medication Sig Dispense Refill  . albuterol (VENTOLIN HFA) 108 (90 Base) MCG/ACT inhaler INHALE 1 TO 2 PUFFS BY MOUTH INTO THE LUNGS PRIOR TO EXERCISE OR EVERY 6 HOURS AS NEEDED 18 g 5   No current facility-administered medications on file prior to visit.    BP 136/80   Pulse 70   Temp (!) 96.6 F (35.9 C) (Temporal)   Ht 6' 2.5" (1.892 m)   Wt 209 lb 4 oz (94.9 kg)   SpO2 97%   BMI 26.51 kg/m    Objective:   Physical Exam  Constitutional: He is oriented to person, place, and time. He appears well-nourished.  HENT:  Right Ear: Tympanic membrane and ear canal normal.  Left Ear: Tympanic membrane and ear canal normal.  Mouth/Throat: Oropharynx is clear and moist.  Eyes: Pupils are equal, round, and reactive to light. EOM are normal.  Cardiovascular: Normal rate and regular rhythm.  Respiratory: Effort normal and breath sounds normal.  GI: Soft. Bowel sounds are normal. There is no abdominal tenderness.  Musculoskeletal:        General: Normal range of motion.     Cervical back: Neck supple.  Neurological: He is alert and oriented to person, place, and time. No cranial nerve deficit.  Reflex Scores:      Patellar reflexes are 2+ on the right side and 2+ on the left side. Skin: Skin is warm and dry.  Psychiatric: He has a normal mood and affect.           Assessment & Plan:

## 2020-02-04 NOTE — Addendum Note (Signed)
Addended by: Jacqualin Combes on: 02/04/2020 11:59 AM   Modules accepted: Orders

## 2020-02-04 NOTE — Assessment & Plan Note (Signed)
Well controlled, using albuterol inhaler prior to exercise which is about 5 days weekly.  No wheezing on exam, continue same.

## 2020-02-04 NOTE — Patient Instructions (Signed)
Stop by the lab prior to leaving today. I will notify you of your results once received.   Continue exercising. You should be getting 150 minutes of moderate intensity exercise weekly.  Continue to work on a healthy diet. Ensure you are consuming 64 ounces of water daily.  Schedule a nurse visit for the second Shingrix vaccine for 2-6 months from today.  It was a pleasure to see you today!   Preventive Care 75 Years and Older, Male Preventive care refers to lifestyle choices and visits with your health care provider that can promote health and wellness. This includes:  A yearly physical exam. This is also called an annual well check.  Regular dental and eye exams.  Immunizations.  Screening for certain conditions.  Healthy lifestyle choices, such as diet and exercise. What can I expect for my preventive care visit? Physical exam Your health care provider will check:  Height and weight. These may be used to calculate body mass index (BMI), which is a measurement that tells if you are at a healthy weight.  Heart rate and blood pressure.  Your skin for abnormal spots. Counseling Your health care provider may ask you questions about:  Alcohol, tobacco, and drug use.  Emotional well-being.  Home and relationship well-being.  Sexual activity.  Eating habits.  History of falls.  Memory and ability to understand (cognition).  Work and work Statistician. What immunizations do I need?  Influenza (flu) vaccine  This is recommended every year. Tetanus, diphtheria, and pertussis (Tdap) vaccine  You may need a Td booster every 10 years. Varicella (chickenpox) vaccine  You may need this vaccine if you have not already been vaccinated. Zoster (shingles) vaccine  You may need this after age 26. Pneumococcal conjugate (PCV13) vaccine  One dose is recommended after age 75. Pneumococcal polysaccharide (PPSV23) vaccine  One dose is recommended after age 75. Measles,  mumps, and rubella (MMR) vaccine  You may need at least one dose of MMR if you were born in 1957 or later. You may also need a second dose. Meningococcal conjugate (MenACWY) vaccine  You may need this if you have certain conditions. Hepatitis A vaccine  You may need this if you have certain conditions or if you travel or work in places where you may be exposed to hepatitis A. Hepatitis B vaccine  You may need this if you have certain conditions or if you travel or work in places where you may be exposed to hepatitis B. Haemophilus influenzae type b (Hib) vaccine  You may need this if you have certain conditions. You may receive vaccines as individual doses or as more than one vaccine together in one shot (combination vaccines). Talk with your health care provider about the risks and benefits of combination vaccines. What tests do I need? Blood tests  Lipid and cholesterol levels. These may be checked every 5 years, or more frequently depending on your overall health.  Hepatitis C test.  Hepatitis B test. Screening  Lung cancer screening. You may have this screening every year starting at age 75 if you have a 30-pack-year history of smoking and currently smoke or have quit within the past 15 years.  Colorectal cancer screening. All adults should have this screening starting at age 75 and continuing until age 43. Your health care provider may recommend screening at age 75 if you are at increased risk. You will have tests every 1-10 years, depending on your results and the type of screening test.  Prostate cancer  screening. Recommendations will vary depending on your family history and other risks.  Diabetes screening. This is done by checking your blood sugar (glucose) after you have not eaten for a while (fasting). You may have this done every 1-3 years.  Abdominal aortic aneurysm (AAA) screening. You may need this if you are a current or former smoker.  Sexually transmitted  disease (STD) testing. Follow these instructions at home: Eating and drinking  Eat a diet that includes fresh fruits and vegetables, whole grains, lean protein, and low-fat dairy products. Limit your intake of foods with high amounts of sugar, saturated fats, and salt.  Take vitamin and mineral supplements as recommended by your health care provider.  Do not drink alcohol if your health care provider tells you not to drink.  If you drink alcohol: ? Limit how much you have to 0-2 drinks a day. ? Be aware of how much alcohol is in your drink. In the [place] one drink equals one 12 oz bottle of beer (355 mL), one 5 oz glass of wine (148 mL), or one 1 oz glass of hard liquor (44 mL). Lifestyle  Take daily care of your teeth and gums.  Stay active. Exercise for at least 30 minutes on 5 or more days each week.  Do not use any products that contain nicotine or tobacco, such as cigarettes, e-cigarettes, and chewing tobacco. If you need help quitting, ask your health care provider.  If you are sexually active, practice safe sex. Use a condom or other form of protection to prevent STIs (sexually transmitted infections).  Talk with your health care provider about taking a low-dose aspirin or statin. What's next?  Visit your health care provider once a year for a well check visit.  Ask your health care provider how often you should have your eyes and teeth checked.  Stay up to date on all vaccines. This information is not intended to replace advice given to you by your health care provider. Make sure you discuss any questions you have with your health care provider. Document Revised: 11/09/2018 Document Reviewed: 11/09/2018 Elsevier Patient Education  2020 [employer].

## 2020-04-09 DIAGNOSIS — B078 Other viral warts: Secondary | ICD-10-CM | POA: Diagnosis not present

## 2020-04-15 ENCOUNTER — Ambulatory Visit: Payer: BC Managed Care – PPO

## 2020-05-07 DIAGNOSIS — B078 Other viral warts: Secondary | ICD-10-CM | POA: Diagnosis not present

## 2020-06-03 DIAGNOSIS — B078 Other viral warts: Secondary | ICD-10-CM | POA: Diagnosis not present

## 2020-06-03 DIAGNOSIS — C44311 Basal cell carcinoma of skin of nose: Secondary | ICD-10-CM | POA: Diagnosis not present

## 2020-06-16 ENCOUNTER — Other Ambulatory Visit: Payer: Self-pay | Admitting: Internal Medicine

## 2020-06-16 DIAGNOSIS — J4520 Mild intermittent asthma, uncomplicated: Secondary | ICD-10-CM

## 2020-07-01 DIAGNOSIS — X32XXXD Exposure to sunlight, subsequent encounter: Secondary | ICD-10-CM | POA: Diagnosis not present

## 2020-07-01 DIAGNOSIS — B078 Other viral warts: Secondary | ICD-10-CM | POA: Diagnosis not present

## 2020-07-01 DIAGNOSIS — Z08 Encounter for follow-up examination after completed treatment for malignant neoplasm: Secondary | ICD-10-CM | POA: Diagnosis not present

## 2020-07-01 DIAGNOSIS — L570 Actinic keratosis: Secondary | ICD-10-CM | POA: Diagnosis not present

## 2020-07-01 DIAGNOSIS — Z85828 Personal history of other malignant neoplasm of skin: Secondary | ICD-10-CM | POA: Diagnosis not present

## 2020-07-21 DIAGNOSIS — S90512A Abrasion, left ankle, initial encounter: Secondary | ICD-10-CM | POA: Diagnosis not present

## 2020-08-11 ENCOUNTER — Other Ambulatory Visit: Payer: Self-pay

## 2020-08-11 ENCOUNTER — Encounter: Payer: Self-pay | Admitting: Family Medicine

## 2020-08-11 ENCOUNTER — Ambulatory Visit (INDEPENDENT_AMBULATORY_CARE_PROVIDER_SITE_OTHER): Payer: BC Managed Care – PPO | Admitting: Family Medicine

## 2020-08-11 VITALS — BP 122/74 | HR 70 | Temp 97.2°F | Ht 74.5 in | Wt 211.3 lb

## 2020-08-11 DIAGNOSIS — Z23 Encounter for immunization: Secondary | ICD-10-CM

## 2020-08-11 DIAGNOSIS — R52 Pain, unspecified: Secondary | ICD-10-CM | POA: Insufficient documentation

## 2020-08-11 HISTORY — DX: Pain, unspecified: R52

## 2020-08-11 NOTE — Patient Instructions (Addendum)
shingrix shot today   I suspect the head tingling has to do with your dental procedure  Anti inflammatory (ibuprofen) may help  Warm /cool compresses also  If worse or not improving in the next week let us know  Also if you develop a rash or eye pain /blurred vision   Keep [place] posted Exam is re assuring

## 2020-08-11 NOTE — Progress Notes (Signed)
Subjective:    Patient ID: Stephen Patterson, male    DOB: 23-Jul-1946, 74 y.o.   MRN: [id number]  This visit occurred during the SARS-CoV-2 public health emergency.  Safety protocols were in place, including screening questions prior to the visit, additional usage of staff PPE, and extensive cleaning of exam room while observing appropriate contact time as indicated for disinfecting solutions.    HPI 74 yo pt of NP Clark presents for c/o head discomfort  Wt Readings from Last 3 Encounters:  08/11/20 211 lb 5 oz (95.9 kg)  02/04/20 209 lb 4 oz (94.9 kg)  01/12/19 207 lb (93.9 kg)   26.77 kg/m  Had a crown put in L mouth last week  He had severe pain in upper lip (lip turned white)-thinks pinched a nerve  Then that improved while in the office  Thurs/fri-had some pain in scalp  Pain ran from post scalp to L forehead (sensitive to the touch)  No rash seen at all   Today it is tingly -not severe and sensitive  Took a few advil No throbbing  Not sharp or fleeting   Feels just a little discomfort above eye  No facial droop    No h/o headaches   Dentist is aware of it  Unsure if related to the crown   First shingrix was 02/04/20 (here- has regular insurance so can get here)  Due for 2nd, would like to get that today  Patient Active Problem List   Diagnosis Date Noted  . Tingling pain 08/11/2020  . Preventative health care 12/07/2016  . Asthma, mild intermittent 07/20/2016  . Lower extremity edema 07/20/2016   Past Medical History:  Diagnosis Date  . Asthma   . COVID-19 virus infection   . Diverticulosis   . Inguinal hernia   . Osteoarthritis of right knee    Past Surgical History:  Procedure Laterality Date  . INGUINAL HERNIA REPAIR  2012,2015  . REPLACEMENT TOTAL KNEE Right 09/2019   Social History   Tobacco Use  . Smoking status: Never Smoker  . Smokeless tobacco: Never Used  Substance Use Topics  . Alcohol use: Yes    Alcohol/week: 0.0 standard drinks     Comment: 2 per week  . Drug use: No   Family History  Problem Relation Age of Onset  . Cancer Mother        breast  . Heart disease Father   . Lymphoma Sister   . Colon cancer Unknown 19       niece   Allergies  Allergen Reactions  . Penicillin G     Other reaction(s): Unknown  . Penicillins     As a child unsure of reaction   Current Outpatient Medications on File Prior to Visit  Medication Sig Dispense Refill  . albuterol (VENTOLIN HFA) 108 (90 Base) MCG/ACT inhaler INHALE 1-2 PUFFS BY MOUTH INTO THE LUNGS PRIOR TO EXERCISE OR EVERY 6 HOURS AS NEEDED 18 g 2  . cephALEXin (KEFLEX) 500 MG capsule Take 500 mg by mouth 3 (three) times daily.     No current facility-administered medications on file prior to visit.    Review of Systems  Constitutional: Negative for activity change, appetite change, fatigue, fever and unexpected weight change.  HENT: Negative for congestion, rhinorrhea, sore throat, trouble swallowing and voice change.   Eyes: Negative for pain, redness, itching and visual disturbance.  Respiratory: Negative for cough, chest tightness, shortness of breath and wheezing.   Cardiovascular: Negative for  chest pain and palpitations.  Gastrointestinal: Negative for abdominal pain, blood in stool, constipation, diarrhea and nausea.  Endocrine: Negative for cold intolerance, heat intolerance, polydipsia and polyuria.  Genitourinary: Negative for difficulty urinating, dysuria, frequency and urgency.  Musculoskeletal: Negative for arthralgias, joint swelling and myalgias.  Skin: Negative for pallor and rash.  Neurological: Negative for dizziness, tremors, seizures, syncope, facial asymmetry, speech difficulty, weakness, light-headedness, numbness and headaches.       Tingling of L scalp (not numb)   Hematological: Negative for adenopathy. Does not bruise/bleed easily.  Psychiatric/Behavioral: Negative for decreased concentration and dysphoric mood. The patient is not  nervous/anxious.        Objective:   Physical Exam Constitutional:      General: He is not in acute distress.    Appearance: Normal appearance. He is normal weight. He is not ill-appearing or diaphoretic.  HENT:     Head: Normocephalic and atraumatic.     Comments: No sinus or temporal tenderness   Hyperesthesia of L scalp to forehead (but not tender) and no rash  No TMJ click or tenderness    Right Ear: Tympanic membrane, ear canal and external ear normal.     Left Ear: Tympanic membrane, ear canal and external ear normal.     Nose: Nose normal.     Mouth/Throat:     Mouth: Mucous membranes are moist.     Pharynx: Oropharynx is clear. No oropharyngeal exudate or posterior oropharyngeal erythema.     Comments: No dental swelling  Eyes:     General:        Right eye: No discharge.        Left eye: No discharge.     Extraocular Movements: Extraocular movements intact.     Conjunctiva/sclera: Conjunctivae normal.     Pupils: Pupils are equal, round, and reactive to light.  Neck:     Vascular: No carotid bruit.  Cardiovascular:     Rate and Rhythm: Normal rate and regular rhythm.     Pulses: Normal pulses.     Heart sounds: Normal heart sounds.  Pulmonary:     Effort: Pulmonary effort is normal. No respiratory distress.     Breath sounds: No wheezing or rales.  Musculoskeletal:     Cervical back: Normal range of motion and neck supple. No tenderness.     Right lower leg: No edema.     Left lower leg: No edema.  Lymphadenopathy:     Cervical: No cervical adenopathy.  Skin:    Coloration: Skin is not pale.     Findings: No bruising, erythema, lesion or rash.  Neurological:     Mental Status: He is alert.     Cranial Nerves: No cranial nerve deficit.     Motor: No weakness.     Coordination: Coordination normal.     Deep Tendon Reflexes: Reflexes normal.  Psychiatric:        Mood and Affect: Mood normal.           Assessment & Plan:   Problem List Items  Addressed This Visit      Other   Tingling pain    Pt reports tingling over L posterior scalp to forehead-started during crown procedure (R lower molar) with local anesth injection  Mild/not very bothersome Fairly nl exam (no rash) -did note hyperesthesia over R scalp  No temporal or TMJ tenderness  Suspect related to nerve irritation from dental procedure (he will keep f/u with dentist) inst to call if  worse or no improvement Disc poss of temporal arteritis or occipital neuralgia-less likely  Due for shingrix shot today        Other Visit Diagnoses    Need for shingles vaccine    -  Primary   Relevant Orders   Varicella-zoster vaccine IM (Shingrix) (Completed)

## 2020-08-11 NOTE — Assessment & Plan Note (Addendum)
Pt reports tingling over L posterior scalp to forehead-started during crown procedure (R lower molar) with local anesth injection  Mild/not very bothersome Fairly nl exam (no rash) -did note hyperesthesia over R scalp  No temporal or TMJ tenderness  Suspect related to nerve irritation from dental procedure (he will keep f/u with dentist) inst to call if worse or no improvement Disc poss of temporal arteritis or occipital or trigeminal  neuralgia-less likely  Due for shingrix shot today

## 2020-08-14 DIAGNOSIS — B029 Zoster without complications: Secondary | ICD-10-CM | POA: Diagnosis not present

## 2020-08-14 DIAGNOSIS — B070 Plantar wart: Secondary | ICD-10-CM | POA: Diagnosis not present

## 2020-08-14 DIAGNOSIS — L84 Corns and callosities: Secondary | ICD-10-CM | POA: Diagnosis not present

## 2020-09-01 ENCOUNTER — Other Ambulatory Visit: Payer: Self-pay | Admitting: Primary Care

## 2020-09-01 DIAGNOSIS — J4520 Mild intermittent asthma, uncomplicated: Secondary | ICD-10-CM

## 2020-09-23 DIAGNOSIS — L600 Ingrowing nail: Secondary | ICD-10-CM | POA: Diagnosis not present

## 2020-11-23 ENCOUNTER — Other Ambulatory Visit: Payer: Self-pay | Admitting: Primary Care

## 2020-11-23 DIAGNOSIS — J4520 Mild intermittent asthma, uncomplicated: Secondary | ICD-10-CM

## 2020-12-18 DIAGNOSIS — Z03818 Encounter for observation for suspected exposure to other biological agents ruled out: Secondary | ICD-10-CM | POA: Diagnosis not present

## 2020-12-30 DIAGNOSIS — C44311 Basal cell carcinoma of skin of nose: Secondary | ICD-10-CM | POA: Diagnosis not present

## 2021-02-03 DIAGNOSIS — L570 Actinic keratosis: Secondary | ICD-10-CM | POA: Diagnosis not present

## 2021-02-03 DIAGNOSIS — C44319 Basal cell carcinoma of skin of other parts of face: Secondary | ICD-10-CM | POA: Diagnosis not present

## 2021-02-03 DIAGNOSIS — Z08 Encounter for follow-up examination after completed treatment for malignant neoplasm: Secondary | ICD-10-CM | POA: Diagnosis not present

## 2021-02-03 DIAGNOSIS — X32XXXD Exposure to sunlight, subsequent encounter: Secondary | ICD-10-CM | POA: Diagnosis not present

## 2021-02-03 DIAGNOSIS — Z85828 Personal history of other malignant neoplasm of skin: Secondary | ICD-10-CM | POA: Diagnosis not present

## 2021-02-20 ENCOUNTER — Other Ambulatory Visit: Payer: Self-pay | Admitting: Primary Care

## 2021-02-20 DIAGNOSIS — J4520 Mild intermittent asthma, uncomplicated: Secondary | ICD-10-CM

## 2021-02-20 NOTE — Telephone Encounter (Signed)
Patient is overdue for CPE and must be scheduled for further refills. Once he is scheduled, okay to send 1 inhaler as pended.

## 2021-02-23 NOTE — Telephone Encounter (Signed)
Patient called back in. He is very angry that his prescription was not filled. I explained to the patient that a message was sent to the provider and that it was in her box to sign off on. He stated " that surely there is someone in the office that can sign off on this simple order." I asked him to give me a moment. Mandy my manager told me that she would get with the provider to get her to sign off on it. I went back to the patient and told him that I have sent his concern to my manager to get it resolved. He was not happy with that and accused our office and myself of not caring. I explained to him that we do care and that is why I have sent this to my manager to get it resolved. He continued to go on about how our office didn't care about his needs. I reassured him that we did care and that's why I was getting the management involved. EM

## 2021-02-23 NOTE — Telephone Encounter (Signed)
Pt called in wanted to know about getting a refill on his inhaler, he is set to have a CPE on 5/10 @ 10:20 am

## 2021-02-23 NOTE — Telephone Encounter (Signed)
Rx has been filled per PCP's pended orders as patient has scheduled appts per request.

## 2021-03-05 DIAGNOSIS — H524 Presbyopia: Secondary | ICD-10-CM | POA: Diagnosis not present

## 2021-03-05 DIAGNOSIS — Z961 Presence of intraocular lens: Secondary | ICD-10-CM | POA: Diagnosis not present

## 2021-03-11 DIAGNOSIS — C44319 Basal cell carcinoma of skin of other parts of face: Secondary | ICD-10-CM | POA: Diagnosis not present

## 2021-03-26 ENCOUNTER — Other Ambulatory Visit: Payer: Self-pay | Admitting: Primary Care

## 2021-03-26 DIAGNOSIS — J4520 Mild intermittent asthma, uncomplicated: Secondary | ICD-10-CM

## 2021-04-07 ENCOUNTER — Encounter: Payer: Self-pay | Admitting: Primary Care

## 2021-04-07 ENCOUNTER — Other Ambulatory Visit: Payer: Self-pay

## 2021-04-07 ENCOUNTER — Ambulatory Visit (INDEPENDENT_AMBULATORY_CARE_PROVIDER_SITE_OTHER): Payer: BC Managed Care – PPO | Admitting: Primary Care

## 2021-04-07 VITALS — BP 126/80 | HR 75 | Temp 98.2°F | Ht 74.0 in | Wt 213.4 lb

## 2021-04-07 DIAGNOSIS — E785 Hyperlipidemia, unspecified: Secondary | ICD-10-CM | POA: Diagnosis not present

## 2021-04-07 DIAGNOSIS — Z23 Encounter for immunization: Secondary | ICD-10-CM | POA: Diagnosis not present

## 2021-04-07 DIAGNOSIS — J4520 Mild intermittent asthma, uncomplicated: Secondary | ICD-10-CM

## 2021-04-07 DIAGNOSIS — Z125 Encounter for screening for malignant neoplasm of prostate: Secondary | ICD-10-CM

## 2021-04-07 DIAGNOSIS — Z0000 Encounter for general adult medical examination without abnormal findings: Secondary | ICD-10-CM

## 2021-04-07 LAB — COMPREHENSIVE METABOLIC PANEL
ALT: 14 U/L (ref 0–53)
AST: 13 U/L (ref 0–37)
Albumin: 4 g/dL (ref 3.5–5.2)
Alkaline Phosphatase: 56 U/L (ref 39–117)
BUN: 22 mg/dL (ref 6–23)
CO2: 32 mEq/L (ref 19–32)
Calcium: 8.8 mg/dL (ref 8.4–10.5)
Chloride: 105 mEq/L (ref 96–112)
Creatinine, Ser: 1.13 mg/dL (ref 0.40–1.50)
GFR: 63.57 mL/min (ref >60.00)
Glucose, Bld: 94 mg/dL (ref 70–99)
Potassium: 4.2 mEq/L (ref 3.5–5.1)
Sodium: 141 mEq/L (ref 135–145)
Total Bilirubin: 0.9 mg/dL (ref 0.2–1.2)
Total Protein: 6.1 g/dL (ref 6.0–8.3)

## 2021-04-07 LAB — LIPID PANEL
Cholesterol: 232 mg/dL — ABNORMAL HIGH (ref 0–200)
HDL: 66.1 mg/dL (ref >39.00)
LDL Cholesterol: 154 mg/dL — ABNORMAL HIGH (ref 0–99)
NonHDL: 165.93
Total CHOL/HDL Ratio: 4
Triglycerides: 62 mg/dL (ref 0.0–149.0)
VLDL: 12.4 mg/dL (ref 0.0–40.0)

## 2021-04-07 LAB — PSA: PSA: 0.72 ng/mL (ref 0.10–4.00)

## 2021-04-07 NOTE — Progress Notes (Signed)
Subjective:    Patient ID: Stephen Patterson, male    DOB: 21-Dec-1945, 75 y.o.   MRN: [id number]  HPI  Stephen Patterson is a very pleasant 75 y.o. male who presents today for complete physical.  Immunizations: -Influenza: Did not complete this season  -Covid-19: Completed three vaccines -Shingles: Completed Shingrix -Pneumonia: Never completed   Diet: He endorses a healthy diet.  Exercise: He is exercising regularly   Eye exam: Completes annually  Dental exam: Completes semi-annually   Colonoscopy: 2014, due in 2024 PSA: Due  BP Readings from Last 3 Encounters:  04/07/21 126/80  08/11/20 122/74  02/04/20 136/80   Wt Readings from Last 3 Encounters:  04/07/21 213 lb 6.4 oz (96.8 kg)  08/11/20 211 lb 5 oz (95.9 kg)  02/04/20 209 lb 4 oz (94.9 kg)      Review of Systems  Constitutional: Negative for unexpected weight change.  HENT: Negative for rhinorrhea.   Eyes: Negative for visual disturbance.  Respiratory: Negative for cough and shortness of breath.   Cardiovascular: Negative for chest pain.  Gastrointestinal: Negative for constipation and diarrhea.  Genitourinary: Negative for difficulty urinating.  Musculoskeletal: Negative for arthralgias and myalgias.  Skin: Negative for rash.  Allergic/Immunologic: Negative for environmental allergies.  Neurological: Negative for dizziness and headaches.  Psychiatric/Behavioral: The patient is not nervous/anxious.          Past Medical History:  Diagnosis Date  . Asthma   . COVID-19 virus infection   . Diverticulosis   . Inguinal hernia   . Osteoarthritis of right knee     Social History   Socioeconomic History  . Marital status: Married    Spouse name: Not on file  . Number of children: 2  . Years of education: Not on file  . Highest education level: Not on file  Occupational History  . Occupation: Orthoptist  Tobacco Use  . Smoking status: Never Smoker  . Smokeless tobacco: Never Used  Substance  and Sexual Activity  . Alcohol use: Yes    Alcohol/week: 0.0 standard drinks    Comment: 2 per week  . Drug use: No  . Sexual activity: Yes  Other Topics Concern  . Not on file  Social History Narrative   Married.   2 children. 3 grandchildren.   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    Social Determinants of Health   Financial Resource Strain: Not on file  Food Insecurity: Not on file  Transportation Needs: Not on file  Physical Activity: Not on file  Stress: Not on file  Social Connections: Not on file  Intimate Partner Violence: Not on file    Past Surgical History:  Procedure Laterality Date  . INGUINAL HERNIA REPAIR  2012,2015  . REPLACEMENT TOTAL KNEE Right 09/2019    Family History  Problem Relation Age of Onset  . Cancer Mother        breast  . Heart disease Father   . Lymphoma Sister   . Colon cancer Unknown 64       niece    Allergies  Allergen Reactions  . Penicillin G     Other reaction(s): Unknown  . Penicillins     As a child unsure of reaction    Current Outpatient Medications on File Prior to Visit  Medication Sig Dispense Refill  . albuterol (VENTOLIN HFA) 108 (90 Base) MCG/ACT  inhaler INHALE 1 TO 2 PUFFS BY MOUTH BEFORE EXERCISE OR EVERY 6 HOURS AS NEEDED 6.7 g 0   No current facility-administered medications on file prior to visit.    BP 126/80   Pulse 75   Temp 98.2 F (36.8 C) (Temporal)   Ht 6\' 2"  (1.88 m)   Wt 213 lb 6.4 oz (96.8 kg)   SpO2 92%   BMI 27.40 kg/m  Objective:   Physical Exam HENT:     Right Ear: Tympanic membrane and ear canal normal.     Left Ear: Tympanic membrane and ear canal normal.     Nose: Nose normal.     Right Sinus: No maxillary sinus tenderness or frontal sinus tenderness.     Left Sinus: No maxillary sinus tenderness or frontal sinus tenderness.  Eyes:     Conjunctiva/sclera: Conjunctivae normal.     Pupils: Pupils  are equal, round, and reactive to light.  Neck:     Thyroid: No thyromegaly.     Vascular: No carotid bruit.  Cardiovascular:     Rate and Rhythm: Normal rate and regular rhythm.     Heart sounds: Normal heart sounds.  Pulmonary:     Effort: Pulmonary effort is normal.     Breath sounds: Normal breath sounds. No wheezing or rales.  Abdominal:     General: Bowel sounds are normal.     Palpations: Abdomen is soft.     Tenderness: There is no abdominal tenderness.  Musculoskeletal:        General: Normal range of motion.     Cervical back: Neck supple.  Skin:    General: Skin is warm and dry.  Neurological:     Mental Status: He is alert and oriented to person, place, and time.     Cranial Nerves: No cranial nerve deficit.     Deep Tendon Reflexes: Reflexes are normal and symmetric.  Psychiatric:        Mood and Affect: Mood normal.           Assessment & Plan:      This visit occurred during the SARS-CoV-2 public health emergency.  Safety protocols were in place, including screening questions prior to the visit, additional usage of staff PPE, and extensive cleaning of exam room while observing appropriate contact time as indicated for disinfecting solutions.

## 2021-04-07 NOTE — Addendum Note (Signed)
Addended by: Francella Solian on: 04/07/2021 11:17 AM   Modules accepted: Orders

## 2021-04-07 NOTE — Assessment & Plan Note (Signed)
Doing well on PRN use of albuterol for which he's using during colder weather and some exercise.   Continue albuterol PRN.

## 2021-04-07 NOTE — Patient Instructions (Signed)
Stop by the lab prior to leaving today. I will notify you of your results once received.   It was a pleasure to see you today!   Preventive Care 38 Years and Older, Male Preventive care refers to lifestyle choices and visits with your health care provider that can promote health and wellness. This includes:  A yearly physical exam. This is also called an annual wellness visit.  Regular dental and eye exams.  Immunizations.  Screening for certain conditions.  Healthy lifestyle choices, such as: ? Eating a healthy diet. ? Getting regular exercise. ? Not using drugs or products that contain nicotine and tobacco. ? Limiting alcohol use. What can I expect for my preventive care visit? Physical exam Your health care provider will check your:  Height and weight. These may be used to calculate your BMI (body mass index). BMI is a measurement that tells if you are at a healthy weight.  Heart rate and blood pressure.  Body temperature.  Skin for abnormal spots. Counseling Your health care provider may ask you questions about your:  Past medical problems.  Family's medical history.  Alcohol, tobacco, and drug use.  Emotional well-being.  Home life and relationship well-being.  Sexual activity.  Diet, exercise, and sleep habits.  History of falls.  Memory and ability to understand (cognition).  Work and work Statistician.  Access to firearms. What immunizations do I need? Vaccines are usually given at various ages, according to a schedule. Your health care provider will recommend vaccines for you based on your age, medical history, and lifestyle or other factors, such as travel or where you work.   What tests do I need? Blood tests  Lipid and cholesterol levels. These may be checked every 5 years, or more often depending on your overall health.  Hepatitis C test.  Hepatitis B test. Screening  Lung cancer screening. You may have this screening every year starting  at age 26 if you have a 30-pack-year history of smoking and currently smoke or have quit within the past 15 years.  Colorectal cancer screening. ? All adults should have this screening starting at age 78 and continuing until age 48. ? Your health care provider may recommend screening at age 96 if you are at increased risk. ? You will have tests every 1-10 years, depending on your results and the type of screening test.  Prostate cancer screening. Recommendations will vary depending on your family history and other risks.  Genital exam to check for testicular cancer or hernias.  Diabetes screening. ? This is done by checking your blood sugar (glucose) after you have not eaten for a while (fasting). ? You may have this done every 1-3 years.  Abdominal aortic aneurysm (AAA) screening. You may need this if you are a current or former smoker.  STD (sexually transmitted disease) testing, if you are at risk. Follow these instructions at home: Eating and drinking  Eat a diet that includes fresh fruits and vegetables, whole grains, lean protein, and low-fat dairy products. Limit your intake of foods with high amounts of sugar, saturated fats, and salt.  Take vitamin and mineral supplements as recommended by your health care provider.  Do not drink alcohol if your health care provider tells you not to drink.  If you drink alcohol: ? Limit how much you have to 0-2 drinks a day. ? Be aware of how much alcohol is in your drink. In the [place] one drink equals one 12 oz bottle of beer (355  mL), one 5 oz glass of wine (148 mL), or one 1 oz glass of hard liquor (44 mL).   Lifestyle  Take daily care of your teeth and gums. Brush your teeth every morning and night with fluoride toothpaste. Floss one time each day.  Stay active. Exercise for at least 30 minutes 5 or more days each week.  Do not use any products that contain nicotine or tobacco, such as cigarettes, e-cigarettes, and chewing tobacco.  If you need help quitting, ask your health care provider.  Do not use drugs.  If you are sexually active, practice safe sex. Use a condom or other form of protection to prevent STIs (sexually transmitted infections).  Talk with your health care provider about taking a low-dose aspirin or statin.  Find healthy ways to cope with stress, such as: ? Meditation, yoga, or listening to music. ? Journaling. ? Talking to a trusted person. ? Spending time with friends and family. Safety  Always wear your seat belt while driving or riding in a vehicle.  Do not drive: ? If you have been drinking alcohol. Do not ride with someone who has been drinking. ? When you are tired or distracted. ? While texting.  Wear a helmet and other protective equipment during sports activities.  If you have firearms in your house, make sure you follow all gun safety procedures. What's next?  Visit your health care provider once a year for an annual wellness visit.  Ask your health care provider how often you should have your eyes and teeth checked.  Stay up to date on all vaccines. This information is not intended to replace advice given to you by your health care provider. Make sure you discuss any questions you have with your health care provider. Document Revised: 08/14/2019 Document Reviewed: 11/09/2018 Elsevier Patient Education  2021 [employer].

## 2021-04-07 NOTE — Assessment & Plan Note (Signed)
Prevnar 20 due, provided today. Other vaccines UTD. Colonoscopy UTD, due in 2024. PSA due and pending.   Encouraged regular exercise and healthy diet.  Exam today stable. Labs pending.

## 2021-04-09 ENCOUNTER — Other Ambulatory Visit: Payer: Self-pay

## 2021-04-09 MED ORDER — ATORVASTATIN CALCIUM 20 MG PO TABS: 20.0000 mg | ORAL_TABLET | Freq: Every day | ORAL | 3 refills | Status: DC

## 2021-04-30 ENCOUNTER — Other Ambulatory Visit: Payer: Self-pay | Admitting: Primary Care

## 2021-04-30 DIAGNOSIS — J4520 Mild intermittent asthma, uncomplicated: Secondary | ICD-10-CM

## 2021-05-27 ENCOUNTER — Other Ambulatory Visit: Payer: Self-pay | Admitting: Primary Care

## 2021-05-27 DIAGNOSIS — E785 Hyperlipidemia, unspecified: Secondary | ICD-10-CM

## 2021-06-09 ENCOUNTER — Other Ambulatory Visit: Payer: BC Managed Care – PPO

## 2021-06-10 ENCOUNTER — Other Ambulatory Visit: Payer: Self-pay | Admitting: Primary Care

## 2021-06-10 DIAGNOSIS — J4520 Mild intermittent asthma, uncomplicated: Secondary | ICD-10-CM

## 2021-07-21 ENCOUNTER — Other Ambulatory Visit: Payer: Self-pay | Admitting: Primary Care

## 2021-07-21 DIAGNOSIS — J4520 Mild intermittent asthma, uncomplicated: Secondary | ICD-10-CM

## 2021-07-21 NOTE — Telephone Encounter (Signed)
Please check on patient. Is he requiring use of his albuterol daily? It looks like he's been requesting refills monthly, but I thought he was using PRN?  Was this an auto refill request?  Tell him that I am just asking to check in on him. Make sure he's ok.

## 2021-07-24 NOTE — Telephone Encounter (Signed)
Called patient is doing well  but does use frequently on average 6-7 times a day.

## 2021-07-24 NOTE — Telephone Encounter (Signed)
Please clarify... 6-7 times daily? Did you mean weekly? Please ask him why he's using his albuterol so frequently.

## 2021-07-27 NOTE — Telephone Encounter (Signed)
While on the phone with patient on Friday I did clarify more than once that he was taking 6-7 times daily. He continued to tell me each time I asked that Anda Kraft was aware of this and he has been using the same way all this time. I have called and left message with him to call back to make sure.

## 2021-07-28 ENCOUNTER — Telehealth: Payer: Self-pay | Admitting: Primary Care

## 2021-07-28 NOTE — Telephone Encounter (Signed)
Pt left v/m requesting cb to ck on status of refill for albuterol.

## 2021-07-28 NOTE — Telephone Encounter (Signed)
  Encourage patient to contact the pharmacy for refills or they can request refills through [place]:  Please schedule appointment if longer than 1 year  NEXT APPOINTMENT DATE:  MEDICATION:albuterol (VENTOLIN HFA) 108 (90 Base) MCG/ACT inhale  Is the patient out of medication?   PHARMACY:[facility] [place], [place] -   Let patient know to contact pharmacy at the end of the day to make sure medication is ready.  Please notify patient to allow 48-72 hours to process  CLINICAL FILLS OUT ALL BELOW:   LAST REFILL:  QTY:  REFILL DATE:    OTHER COMMENTS:    Okay for refill?  Please advise

## 2021-08-04 ENCOUNTER — Other Ambulatory Visit: Payer: Self-pay | Admitting: Primary Care

## 2021-08-04 DIAGNOSIS — J4520 Mild intermittent asthma, uncomplicated: Secondary | ICD-10-CM

## 2021-08-04 NOTE — Telephone Encounter (Signed)
Stephen Patterson called in wanted to know about the inhaler prescription

## 2021-08-19 ENCOUNTER — Other Ambulatory Visit (INDEPENDENT_AMBULATORY_CARE_PROVIDER_SITE_OTHER): Payer: BC Managed Care – PPO

## 2021-08-19 ENCOUNTER — Other Ambulatory Visit: Payer: Self-pay

## 2021-08-19 DIAGNOSIS — E785 Hyperlipidemia, unspecified: Secondary | ICD-10-CM

## 2021-08-19 LAB — LIPID PANEL
Cholesterol: 215 mg/dL — ABNORMAL HIGH (ref 0–200)
HDL: 64.8 mg/dL (ref >39.00)
LDL Cholesterol: 138 mg/dL — ABNORMAL HIGH (ref 0–99)
NonHDL: 149.77
Total CHOL/HDL Ratio: 3
Triglycerides: 57 mg/dL (ref 0.0–149.0)
VLDL: 11.4 mg/dL (ref 0.0–40.0)

## 2021-08-19 LAB — HEPATIC FUNCTION PANEL
ALT: 11 U/L (ref 0–53)
AST: 15 U/L (ref 0–37)
Albumin: 4.1 g/dL (ref 3.5–5.2)
Alkaline Phosphatase: 62 U/L (ref 39–117)
Bilirubin, Direct: 0.2 mg/dL (ref 0.0–0.3)
Total Bilirubin: 1.1 mg/dL (ref 0.2–1.2)
Total Protein: 6.7 g/dL (ref 6.0–8.3)

## 2021-08-20 ENCOUNTER — Telehealth: Payer: Self-pay | Admitting: Primary Care

## 2021-08-20 NOTE — Telephone Encounter (Signed)
Stephen Patterson called in wanted to know if he can get his lab results mailed to him to keep a copy on hand and to get a phone call for his results and to call his cell phone . And also he stated that he is good with his appointment being on 10/11.

## 2021-08-21 NOTE — Telephone Encounter (Signed)
I called patient to verify address. He no longer needs sent was able to find on my my chart. Also asked per lab notes and he is NOT taking cholesterol medications at all. Never started wanted to work on diet and exercise.

## 2021-08-23 NOTE — Telephone Encounter (Signed)
Noted. Will discuss on October 11th.

## 2021-09-08 ENCOUNTER — Ambulatory Visit: Payer: BC Managed Care – PPO | Admitting: Primary Care

## 2021-09-22 ENCOUNTER — Other Ambulatory Visit: Payer: Self-pay

## 2021-09-22 ENCOUNTER — Ambulatory Visit (INDEPENDENT_AMBULATORY_CARE_PROVIDER_SITE_OTHER): Payer: BC Managed Care – PPO | Admitting: Primary Care

## 2021-09-22 ENCOUNTER — Ambulatory Visit (INDEPENDENT_AMBULATORY_CARE_PROVIDER_SITE_OTHER)
Admission: RE | Admit: 2021-09-22 | Discharge: 2021-09-22 | Disposition: A | Discharge status: Home / Self Care | Payer: BC Managed Care – PPO | Source: Ambulatory Visit | Attending: Primary Care | Admitting: Primary Care

## 2021-09-22 ENCOUNTER — Encounter: Payer: Self-pay | Admitting: Primary Care

## 2021-09-22 VITALS — BP 140/82 | HR 55 | Temp 97.1°F | Ht 74.0 in | Wt 213.0 lb

## 2021-09-22 DIAGNOSIS — E785 Hyperlipidemia, unspecified: Secondary | ICD-10-CM | POA: Diagnosis not present

## 2021-09-22 DIAGNOSIS — K5939 Other megacolon: Secondary | ICD-10-CM | POA: Diagnosis not present

## 2021-09-22 DIAGNOSIS — R140 Abdominal distension (gaseous): Secondary | ICD-10-CM | POA: Insufficient documentation

## 2021-09-22 DIAGNOSIS — Z23 Encounter for immunization: Secondary | ICD-10-CM | POA: Diagnosis not present

## 2021-09-22 DIAGNOSIS — J4520 Mild intermittent asthma, uncomplicated: Secondary | ICD-10-CM

## 2021-09-22 DIAGNOSIS — K6389 Other specified diseases of intestine: Secondary | ICD-10-CM | POA: Diagnosis not present

## 2021-09-22 HISTORY — DX: Abdominal distension (gaseous): R14.0

## 2021-09-22 MED ORDER — ALBUTEROL SULFATE HFA 108 (90 BASE) MCG/ACT IN AERS
INHALATION_SPRAY | RESPIRATORY_TRACT | 3 refills | Status: DC
Start: 2021-09-22 — End: 2022-05-04

## 2021-09-22 MED ORDER — ATORVASTATIN CALCIUM 20 MG PO TABS: 20.0000 mg | ORAL_TABLET | Freq: Every day | ORAL | 2 refills | Status: DC

## 2021-09-22 NOTE — Patient Instructions (Signed)
Reduce your use of the albuterol inhaler as it can cause multiple problems.   Complete xray(s) prior to leaving today. I will notify you of your results once received.  Start atorvastatin 20 mg daily for cholesterol and prevention of heart disease.   It was a pleasure to see you today!

## 2021-09-22 NOTE — Progress Notes (Signed)
Subjective:    Patient ID: Stephen Patterson, male    DOB: Apr 05, 1946, 75 y.o.   MRN: [id number]  HPI  Stephen Patterson is a very pleasant 75 y.o. male with a history of mild asthma, lower extremity swelling, hyperlipidemia who presents today to discuss recent cholesterol results, albuterol inhaler refill, and to discuss abdominal bloating.   He initially noticed his bloating 6-8 week ago which is located to the upper abdomen, mostly middle. He denies constipation, diarrhea, changes in bowel movements, abdominal pain, esophageal burning, belching. He works out at [employer] daily, 6 days weekly and his bloating doesn't bother him during his work outs. He does travel internationally a few times annually, never had any GI issues except for a few episodes of diarrhea during a trip to the [place] a month ago.   He's never taken his atorvastatin cholesterol medication. Family history of early death from heart disease in most of his father's side of the family. His father passed away at the age of 57 from heart disease. Recent LDL of 138. He was prescribed atorvastatin earlier this year due to high ASCVD risk, LDL readings, and family history. He is ready to start now. He is active daily at the gym and endorses a healthy diet.   He continues to use his albuterol inhaler most everyday. He uses his inhaler out of habit rather than need. He feels anxious if he doesn't have his inhaler on him. He denies wheezing or chest tightness with exercise. Today he is requesting a once monthly albuterol inhaler with multiple refills.  He was once on Advair but didn't like due to the powder. He purchased an inhaler OTC, has it it in his pocket today, the inhaler has been empty for a few weeks.   The 10-year ASCVD risk score (Arnett DK, et al., 2019) is: 26.8%   Values used to calculate the score:     Age: 66 years     Sex: Male     Is Non-Hispanic African American: No     Diabetic: No     Tobacco smoker: No     Systolic  Blood Pressure: 140 mmHg     Is BP treated: No     HDL Cholesterol: 64.8 mg/dL     Total Cholesterol: 215 mg/dL   BP Readings from Last 3 Encounters:  09/22/21 140/82  04/07/21 126/80  08/11/20 122/74   Wt Readings from Last 3 Encounters:  09/22/21 213 lb (96.6 kg)  04/07/21 213 lb 6.4 oz (96.8 kg)  08/11/20 211 lb 5 oz (95.9 kg)      Review of Systems  Respiratory:  Negative for chest tightness, shortness of breath and wheezing.   Cardiovascular:  Negative for chest pain.  Gastrointestinal:  Negative for abdominal pain, constipation, diarrhea, nausea and vomiting.       Abdominal bloating        Past Medical History:  Diagnosis Date   Asthma    COVID-19 virus infection    Diverticulosis    Inguinal hernia    Osteoarthritis of right knee     Social History   Socioeconomic History   Marital status: Married    Spouse name: Not on file   Number of children: 2   Years of education: Not on file   Highest education level: Not on file  Occupational History   Occupation: Orthoptist  Tobacco Use   Smoking status: Never   Smokeless tobacco: Never  Substance and Sexual  Activity   Alcohol use: Yes    Alcohol/week: 0.0 standard drinks    Comment: 2 per week   Drug use: No   Sexual activity: Yes  Other Topics Concern   Not on file  Social History Narrative   Married.   2 children. 3 grandchildren.   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    Social Determinants of Health   Financial Resource Strain: Not on file  Food Insecurity: Not on file  Transportation Needs: Not on file  Physical Activity: Not on file  Stress: Not on file  Social Connections: Not on file  Intimate Partner Violence: Not on file    Past Surgical History:  Procedure Laterality Date   INGUINAL HERNIA REPAIR  2012,2015   REPLACEMENT TOTAL KNEE Right 09/2019    Family History  Problem Relation Age of  Onset   Cancer Mother        breast   Heart disease Father    Lymphoma Sister    Colon cancer Unknown 64       niece    Allergies  Allergen Reactions   Penicillin G     Other reaction(s): Unknown   Penicillins     As a child unsure of reaction    Current Outpatient Medications on File Prior to Visit  Medication Sig Dispense Refill   albuterol (VENTOLIN HFA) 108 (90 Base) MCG/ACT inhaler INHALE 1 TO 2 PUFFS BY MOUTH EVERY DAY 6 HOURS BEFORE EXERCISE AS NEEDED 6.7 g 0   atorvastatin (LIPITOR) 20 MG tablet Take 1 tablet (20 mg total) by mouth daily. (Patient not taking: Reported on 09/22/2021) 90 tablet 3   No current facility-administered medications on file prior to visit.    BP 140/82   Pulse (!) 55   Temp (!) 97.1 F (36.2 C) (Temporal)   Ht 6\' 2"  (1.88 m)   Wt 213 lb (96.6 kg)   SpO2 98%   BMI 27.35 kg/m  Objective:   Physical Exam Cardiovascular:     Rate and Rhythm: Normal rate and regular rhythm.  Pulmonary:     Effort: Pulmonary effort is normal.     Breath sounds: Normal breath sounds. No wheezing or rales.  Abdominal:     General: Bowel sounds are normal.     Palpations: Abdomen is soft.     Tenderness: There is no abdominal tenderness.  Musculoskeletal:     Cervical back: Neck supple.  Skin:    General: Skin is warm and dry.  Neurological:     Mental Status: He is alert and oriented to person, place, and time.          Assessment & Plan:      This visit occurred during the SARS-CoV-2 public health emergency.  Safety protocols were in place, including screening questions prior to the visit, additional usage of staff PPE, and extensive cleaning of exam room while observing appropriate contact time as indicated for disinfecting solutions.

## 2021-09-22 NOTE — Assessment & Plan Note (Signed)
Again, discussed current asthma guidelines regarding inhaler use for SABA medications.  I strongly discouraged daily use of his albuterol as he is clearly doing this out of habit and anxiety rather than need. We did discuss that this was appropriate to use PRN prior to exercise, but he denies needing to do so. We discussed use of Symbicort for daily use, he declines and acknowledges that he does not need.   He will try to refrain from daily use, he will try to make an inhaler last for 60 days. He will update. No wheezing on exam.

## 2021-09-22 NOTE — Assessment & Plan Note (Signed)
The 10-year ASCVD risk score (Arnett DK, et al., 2019) is: 26.8%   Values used to calculate the score:     Age: 75 years     Sex: Male     Is Non-Hispanic African American: No     Diabetic: No     Tobacco smoker: No     Systolic Blood Pressure: 923 mmHg     Is BP treated: No     HDL Cholesterol: 64.8 mg/dL     Total Cholesterol: 215 mg/dL  Significant family history of early death from heart disease, discussed this today. He will start atorvastatin 20 mg. Refills provided. Will repeat lipids during CPE.

## 2021-09-22 NOTE — Assessment & Plan Note (Signed)
Unclear etiology.  Consider GERD, H Pylori given international travel.   Start with abdominal xray. Consider H pylori testing. Await results.  He declines further testing today, he would like to continue to monitor symptoms.

## 2021-09-24 ENCOUNTER — Other Ambulatory Visit: Payer: Self-pay | Admitting: Primary Care

## 2021-09-24 DIAGNOSIS — R140 Abdominal distension (gaseous): Secondary | ICD-10-CM

## 2021-09-25 ENCOUNTER — Telehealth: Payer: Self-pay

## 2021-09-25 ENCOUNTER — Other Ambulatory Visit: Payer: Self-pay

## 2021-09-25 ENCOUNTER — Ambulatory Visit
Admission: RE | Admit: 2021-09-25 | Discharge: 2021-09-25 | Disposition: A | Discharge status: Home / Self Care | Payer: BC Managed Care – PPO | Source: Ambulatory Visit | Attending: Primary Care | Admitting: Primary Care

## 2021-09-25 DIAGNOSIS — N3289 Other specified disorders of bladder: Secondary | ICD-10-CM | POA: Diagnosis not present

## 2021-09-25 DIAGNOSIS — K449 Diaphragmatic hernia without obstruction or gangrene: Secondary | ICD-10-CM | POA: Diagnosis not present

## 2021-09-25 DIAGNOSIS — R140 Abdominal distension (gaseous): Secondary | ICD-10-CM | POA: Insufficient documentation

## 2021-09-25 DIAGNOSIS — K4090 Unilateral inguinal hernia, without obstruction or gangrene, not specified as recurrent: Secondary | ICD-10-CM | POA: Diagnosis not present

## 2021-09-25 DIAGNOSIS — N281 Cyst of kidney, acquired: Secondary | ICD-10-CM | POA: Diagnosis not present

## 2021-09-25 LAB — POCT I-STAT CREATININE: Creatinine, Ser: 1.2 mg/dL (ref 0.61–1.24)

## 2021-09-25 MED ORDER — IOHEXOL 300 MG/ML  SOLN
100.0000 mL | Freq: Once | INTRAMUSCULAR | Status: AC | PRN
  Administered 2021-09-25: 100 mL via INTRAVENOUS

## 2021-09-25 NOTE — Telephone Encounter (Signed)
Pt returning your call

## 2021-09-25 NOTE — Telephone Encounter (Signed)
Noted, see result note  

## 2021-09-25 NOTE — Telephone Encounter (Signed)
Patient is scheduled for today with [facility]  Pt is aware

## 2021-09-25 NOTE — Telephone Encounter (Signed)
Christy with [facility] CT Dept called report on CT abd pelvis w contrast. Pt is not waiting and report is in Epic. Sending note to Gentry Fitz NP and [facility] CMA. Will also teams Joellen.

## 2021-09-25 NOTE — Telephone Encounter (Signed)
FYI. Would like the CT done today if possible.

## 2021-11-12 DIAGNOSIS — K4031 Unilateral inguinal hernia, with obstruction, without gangrene, recurrent: Secondary | ICD-10-CM | POA: Diagnosis not present

## 2021-12-15 DIAGNOSIS — H15101 Unspecified episcleritis, right eye: Secondary | ICD-10-CM | POA: Diagnosis not present

## 2022-02-04 DIAGNOSIS — L570 Actinic keratosis: Secondary | ICD-10-CM | POA: Diagnosis not present

## 2022-02-04 DIAGNOSIS — Z85828 Personal history of other malignant neoplasm of skin: Secondary | ICD-10-CM | POA: Diagnosis not present

## 2022-02-04 DIAGNOSIS — L300 Nummular dermatitis: Secondary | ICD-10-CM | POA: Diagnosis not present

## 2022-02-04 DIAGNOSIS — R21 Rash and other nonspecific skin eruption: Secondary | ICD-10-CM | POA: Diagnosis not present

## 2022-02-04 DIAGNOSIS — L821 Other seborrheic keratosis: Secondary | ICD-10-CM | POA: Diagnosis not present

## 2022-02-15 DIAGNOSIS — C44319 Basal cell carcinoma of skin of other parts of face: Secondary | ICD-10-CM | POA: Diagnosis not present

## 2022-02-15 DIAGNOSIS — D485 Neoplasm of uncertain behavior of skin: Secondary | ICD-10-CM | POA: Diagnosis not present

## 2022-03-25 DIAGNOSIS — C441122 Basal cell carcinoma of skin of right lower eyelid, including canthus: Secondary | ICD-10-CM | POA: Diagnosis not present

## 2022-04-15 ENCOUNTER — Encounter: Payer: Self-pay | Admitting: Primary Care

## 2022-04-15 ENCOUNTER — Ambulatory Visit (INDEPENDENT_AMBULATORY_CARE_PROVIDER_SITE_OTHER): Payer: BC Managed Care – PPO | Admitting: Primary Care

## 2022-04-15 VITALS — BP 118/80 | HR 60 | Ht 75.0 in | Wt 210.6 lb

## 2022-04-15 DIAGNOSIS — E785 Hyperlipidemia, unspecified: Secondary | ICD-10-CM

## 2022-04-15 DIAGNOSIS — Z125 Encounter for screening for malignant neoplasm of prostate: Secondary | ICD-10-CM | POA: Diagnosis not present

## 2022-04-15 DIAGNOSIS — Z0000 Encounter for general adult medical examination without abnormal findings: Secondary | ICD-10-CM

## 2022-04-15 DIAGNOSIS — J4520 Mild intermittent asthma, uncomplicated: Secondary | ICD-10-CM | POA: Diagnosis not present

## 2022-04-15 LAB — LIPID PANEL
Cholesterol: 211 mg/dL — ABNORMAL HIGH (ref 0–200)
HDL: 64.4 mg/dL (ref >39.00)
LDL Cholesterol: 135 mg/dL — ABNORMAL HIGH (ref 0–99)
NonHDL: 146.79
Total CHOL/HDL Ratio: 3
Triglycerides: 61 mg/dL (ref 0.0–149.0)
VLDL: 12.2 mg/dL (ref 0.0–40.0)

## 2022-04-15 LAB — COMPREHENSIVE METABOLIC PANEL
ALT: 15 U/L (ref 0–53)
AST: 13 U/L (ref 0–37)
Albumin: 4.2 g/dL (ref 3.5–5.2)
Alkaline Phosphatase: 61 U/L (ref 39–117)
BUN: 21 mg/dL (ref 6–23)
CO2: 32 mEq/L (ref 19–32)
Calcium: 9.1 mg/dL (ref 8.4–10.5)
Chloride: 105 mEq/L (ref 96–112)
Creatinine, Ser: 1.22 mg/dL (ref 0.40–1.50)
GFR: 57.57 mL/min — ABNORMAL LOW (ref >60.00)
Glucose, Bld: 97 mg/dL (ref 70–99)
Potassium: 4.7 mEq/L (ref 3.5–5.1)
Sodium: 141 mEq/L (ref 135–145)
Total Bilirubin: 1 mg/dL (ref 0.2–1.2)
Total Protein: 6.4 g/dL (ref 6.0–8.3)

## 2022-04-15 LAB — CBC
HCT: 44.3 % (ref 39.0–52.0)
Hemoglobin: 15 g/dL (ref 13.0–17.0)
MCHC: 33.9 g/dL (ref 30.0–36.0)
MCV: 87.9 fl (ref 78.0–100.0)
Platelets: 161 10*3/uL (ref 150.0–400.0)
RBC: 5.03 Mil/uL (ref 4.22–5.81)
RDW: 13.1 % (ref 11.5–15.5)
WBC: 4 10*3/uL (ref 4.0–10.5)

## 2022-04-15 LAB — PSA: PSA: 0.74 ng/mL (ref 0.10–4.00)

## 2022-04-15 NOTE — Assessment & Plan Note (Addendum)
Declines Pneumovax 23. Other vaccines UTD. Colonoscopy UTD, due 2024. PSA due and pending. Life expectancy is >10 years.  Discussed the importance of a healthy diet and regular exercise in order for weight loss, and to reduce the risk of further co-morbidity.  Exam today stable Labs pending

## 2022-04-15 NOTE — Assessment & Plan Note (Signed)
Patient stopped atorvastatin 6 months ago, did not want to take it. No issues.  He is working on diet. Repeat lipid panel pending.

## 2022-04-15 NOTE — Patient Instructions (Signed)
Stop by the lab prior to leaving today. I will notify you of your results once received.   It was a pleasure to see you today!  Preventive Care 65 Years and Older, Male Preventive care refers to lifestyle choices and visits with your health care provider that can promote health and wellness. Preventive care visits are also called wellness exams. What can I expect for my preventive care visit? Counseling During your preventive care visit, your health care provider may ask about your: Medical history, including: Past medical problems. Family medical history. History of falls. Current health, including: Emotional well-being. Home life and relationship well-being. Sexual activity. Memory and ability to understand (cognition). Lifestyle, including: Alcohol, nicotine or tobacco, and drug use. Access to firearms. Diet, exercise, and sleep habits. Work and work environment. Sunscreen use. Safety issues such as seatbelt and bike helmet use. Physical exam Your health care provider will check your: Height and weight. These may be used to calculate your BMI (body mass index). BMI is a measurement that tells if you are at a healthy weight. Waist circumference. This measures the distance around your waistline. This measurement also tells if you are at a healthy weight and may help predict your risk of certain diseases, such as type 2 diabetes and high blood pressure. Heart rate and blood pressure. Body temperature. Skin for abnormal spots. What immunizations do I need?  Vaccines are usually given at various ages, according to a schedule. Your health care provider will recommend vaccines for you based on your age, medical history, and lifestyle or other factors, such as travel or where you work. What tests do I need? Screening Your health care provider may recommend screening tests for certain conditions. This may include: Lipid and cholesterol levels. Diabetes screening. This is done by  checking your blood sugar (glucose) after you have not eaten for a while (fasting). Hepatitis C test. Hepatitis B test. HIV (human immunodeficiency virus) test. STI (sexually transmitted infection) testing, if you are at risk. Lung cancer screening. Colorectal cancer screening. Prostate cancer screening. Abdominal aortic aneurysm (AAA) screening. You may need this if you are a current or former smoker. Talk with your health care provider about your test results, treatment options, and if necessary, the need for more tests. Follow these instructions at home: Eating and drinking  Eat a diet that includes fresh fruits and vegetables, whole grains, lean protein, and low-fat dairy products. Limit your intake of foods with high amounts of sugar, saturated fats, and salt. Take vitamin and mineral supplements as recommended by your health care provider. Do not drink alcohol if your health care provider tells you not to drink. If you drink alcohol: Limit how much you have to 0-2 drinks a day. Know how much alcohol is in your drink. In the [place] one drink equals one 12 oz bottle of beer (355 mL), one 5 oz glass of wine (148 mL), or one 1 oz glass of hard liquor (44 mL). Lifestyle Brush your teeth every morning and night with fluoride toothpaste. Floss one time each day. Exercise for at least 30 minutes 5 or more days each week. Do not use any products that contain nicotine or tobacco. These products include cigarettes, chewing tobacco, and vaping devices, such as e-cigarettes. If you need help quitting, ask your health care provider. Do not use drugs. If you are sexually active, practice safe sex. Use a condom or other form of protection to prevent STIs. Take aspirin only as told by your health care   provider. Make sure that you understand how much to take and what form to take. Work with your health care provider to find out whether it is safe and beneficial for you to take aspirin daily. Ask your  health care provider if you need to take a cholesterol-lowering medicine (statin). Find healthy ways to manage stress, such as: Meditation, yoga, or listening to music. Journaling. Talking to a trusted person. Spending time with friends and family. Safety Always wear your seat belt while driving or riding in a vehicle. Do not drive: If you have been drinking alcohol. Do not ride with someone who has been drinking. When you are tired or distracted. While texting. If you have been using any mind-altering substances or drugs. Wear a helmet and other protective equipment during sports activities. If you have firearms in your house, make sure you follow all gun safety procedures. Minimize exposure to UV radiation to reduce your risk of skin cancer. What's next? Visit your health care provider once a year for an annual wellness visit. Ask your health care provider how often you should have your eyes and teeth checked. Stay up to date on all vaccines. This information is not intended to replace advice given to you by your health care provider. Make sure you discuss any questions you have with your health care provider. Document Revised: 05/13/2021 Document Reviewed: 05/13/2021 Elsevier Patient Education  2023 Elsevier Inc.  

## 2022-04-15 NOTE — Progress Notes (Signed)
Subjective:    Patient ID: Stephen Patterson, male    DOB: 1946/04/16, 76 y.o.   MRN: [id number]  HPI  Stephen Patterson is a very pleasant 76 y.o. male who presents today for complete physical and follow up of chronic conditions.  The 10-year ASCVD risk score (Arnett DK, et al., 2019) is: 22%   Values used to calculate the score:     Age: 66 years     Sex: Male     Is Non-Hispanic African American: No     Diabetic: No     Tobacco smoker: No     Systolic Blood Pressure: 937 mmHg     Is BP treated: No     HDL Cholesterol: 64.8 mg/dL     Total Cholesterol: 215 mg/dL   Immunizations: -Influenza: Completed last season -Covid-19: 3 vaccines -Shingles: Completed Shingrix -Pneumonia: Prevnar 20 in 2022, due for Pneumovax 23 and declines   Diet: Fair diet.  Exercise: No regular exercise.  Eye exam: Completes annually  Dental exam: Completes semi-annually   Colonoscopy: Completed in 2014, due 2024 PSA: Due  BP Readings from Last 3 Encounters:  04/15/22 118/80  09/22/21 140/82  04/07/21 126/80        Review of Systems  Constitutional:  Negative for unexpected weight change.  HENT:  Negative for rhinorrhea.   Respiratory:  Negative for cough and shortness of breath.   Cardiovascular:  Negative for chest pain.  Gastrointestinal:  Negative for constipation and diarrhea.  Genitourinary:  Negative for difficulty urinating.  Musculoskeletal:  Negative for arthralgias and myalgias.  Skin:  Negative for rash.  Allergic/Immunologic: Negative for environmental allergies.  Neurological:  Negative for dizziness and headaches.  Psychiatric/Behavioral:  The patient is not nervous/anxious.         Past Medical History:  Diagnosis Date   Asthma    COVID-19 virus infection    Diverticulosis    Inguinal hernia    Osteoarthritis of right knee     Social History   Socioeconomic History   Marital status: Married    Spouse name: Not on file   Number of children: 2   Years of  education: Not on file   Highest education level: Not on file  Occupational History   Occupation: Orthoptist  Tobacco Use   Smoking status: Never   Smokeless tobacco: Never  Vaping Use   Vaping Use: Never used  Substance and Sexual Activity   Alcohol use: Yes    Alcohol/week: 0.0 standard drinks    Comment: 2 per week   Drug use: No   Sexual activity: Yes  Other Topics Concern   Not on file  Social History Narrative   Married.   2 children. 3 grandchildren.   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    Social Determinants of Health   Financial Resource Strain: Not on file  Food Insecurity: Not on file  Transportation Needs: Not on file  Physical Activity: Not on file  Stress: Not on file  Social Connections: Not on file  Intimate Partner Violence: Not on file    Past Surgical History:  Procedure Laterality Date   INGUINAL HERNIA REPAIR  [id number]   REPLACEMENT TOTAL KNEE Right 09/2019    Family History  Problem Relation Age of Onset   Cancer Mother        breast   Heart disease  Father    Lymphoma Sister    Colon cancer Unknown 85       niece    Allergies  Allergen Reactions   Penicillin G     Other reaction(s): Unknown   Penicillins     As a child unsure of reaction    Current Outpatient Medications on File Prior to Visit  Medication Sig Dispense Refill   albuterol (VENTOLIN HFA) 108 (90 Base) MCG/ACT inhaler INHALE 1 TO 2 PUFFS BY MOUTH 6 HOURS BEFORE EXERCISE DAILY AS NEEDED 6.7 g 3   atorvastatin (LIPITOR) 20 MG tablet Take 1 tablet (20 mg total) by mouth daily. For cholesterol. (Patient not taking: Reported on 04/15/2022) 90 tablet 2   No current facility-administered medications on file prior to visit.    BP 118/80   Pulse 60   Ht '6\' 3"'$  (1.905 m)   Wt 210 lb 9.6 oz (95.5 kg)   SpO2 96%   BMI 26.32 kg/m  Objective:   Physical Exam HENT:     Right Ear:  Tympanic membrane and ear canal normal.     Left Ear: Tympanic membrane and ear canal normal.     Nose: Nose normal.     Right Sinus: No maxillary sinus tenderness or frontal sinus tenderness.     Left Sinus: No maxillary sinus tenderness or frontal sinus tenderness.  Eyes:     Conjunctiva/sclera: Conjunctivae normal.  Neck:     Thyroid: No thyromegaly.     Vascular: No carotid bruit.  Cardiovascular:     Rate and Rhythm: Normal rate and regular rhythm.     Heart sounds: Normal heart sounds.  Pulmonary:     Effort: Pulmonary effort is normal.     Breath sounds: Normal breath sounds. No wheezing or rales.  Abdominal:     General: Bowel sounds are normal.     Palpations: Abdomen is soft.     Tenderness: There is no abdominal tenderness.  Musculoskeletal:        General: Normal range of motion.     Cervical back: Neck supple.  Skin:    General: Skin is warm and dry.  Neurological:     Mental Status: He is alert and oriented to person, place, and time.     Cranial Nerves: No cranial nerve deficit.     Deep Tendon Reflexes: Reflexes are normal and symmetric.  Psychiatric:        Mood and Affect: Mood normal.          Assessment & Plan:      This visit occurred during the SARS-CoV-2 public health emergency.  Safety protocols were in place, including screening questions prior to the visit, additional usage of staff PPE, and extensive cleaning of exam room while observing appropriate contact time as indicated for disinfecting solutions.

## 2022-04-15 NOTE — Assessment & Plan Note (Signed)
No recent use of albuterol inhaler. No wheezing on exam  Continue albuterol PRN.

## 2022-05-03 ENCOUNTER — Other Ambulatory Visit: Payer: Self-pay | Admitting: Primary Care

## 2022-05-03 DIAGNOSIS — J4520 Mild intermittent asthma, uncomplicated: Secondary | ICD-10-CM

## 2022-05-31 ENCOUNTER — Other Ambulatory Visit: Payer: Self-pay | Admitting: Primary Care

## 2022-05-31 DIAGNOSIS — E785 Hyperlipidemia, unspecified: Secondary | ICD-10-CM

## 2022-05-31 DIAGNOSIS — N289 Disorder of kidney and ureter, unspecified: Secondary | ICD-10-CM

## 2022-06-14 ENCOUNTER — Other Ambulatory Visit: Payer: BC Managed Care – PPO

## 2022-06-28 ENCOUNTER — Other Ambulatory Visit: Payer: Self-pay | Admitting: Primary Care

## 2022-06-28 DIAGNOSIS — J4520 Mild intermittent asthma, uncomplicated: Secondary | ICD-10-CM

## 2022-06-29 IMAGING — DX DG ABDOMEN 2V
3 series · 3 of 3 positions shown · non-contrast
Comparison: None.

CLINICAL DATA: Abdominal bloating

EXAM:
ABDOMEN - 2 VIEW

[abdomen supine ap (1 of 2)]
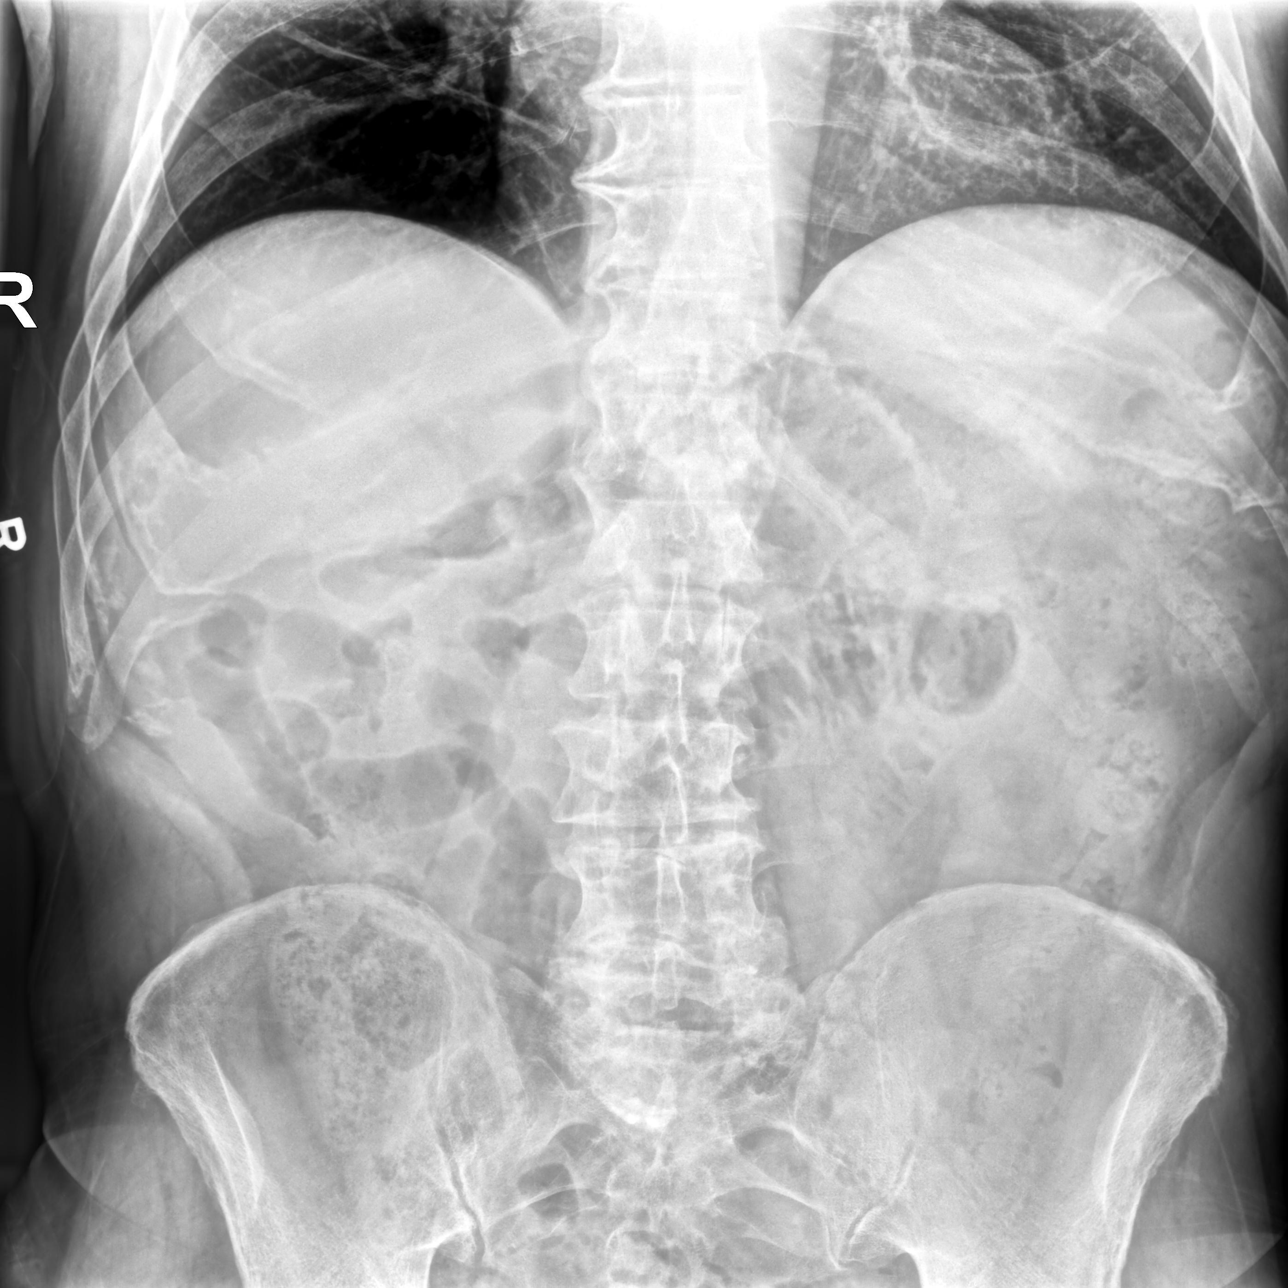

[abdomen supine ap (2 of 2)]
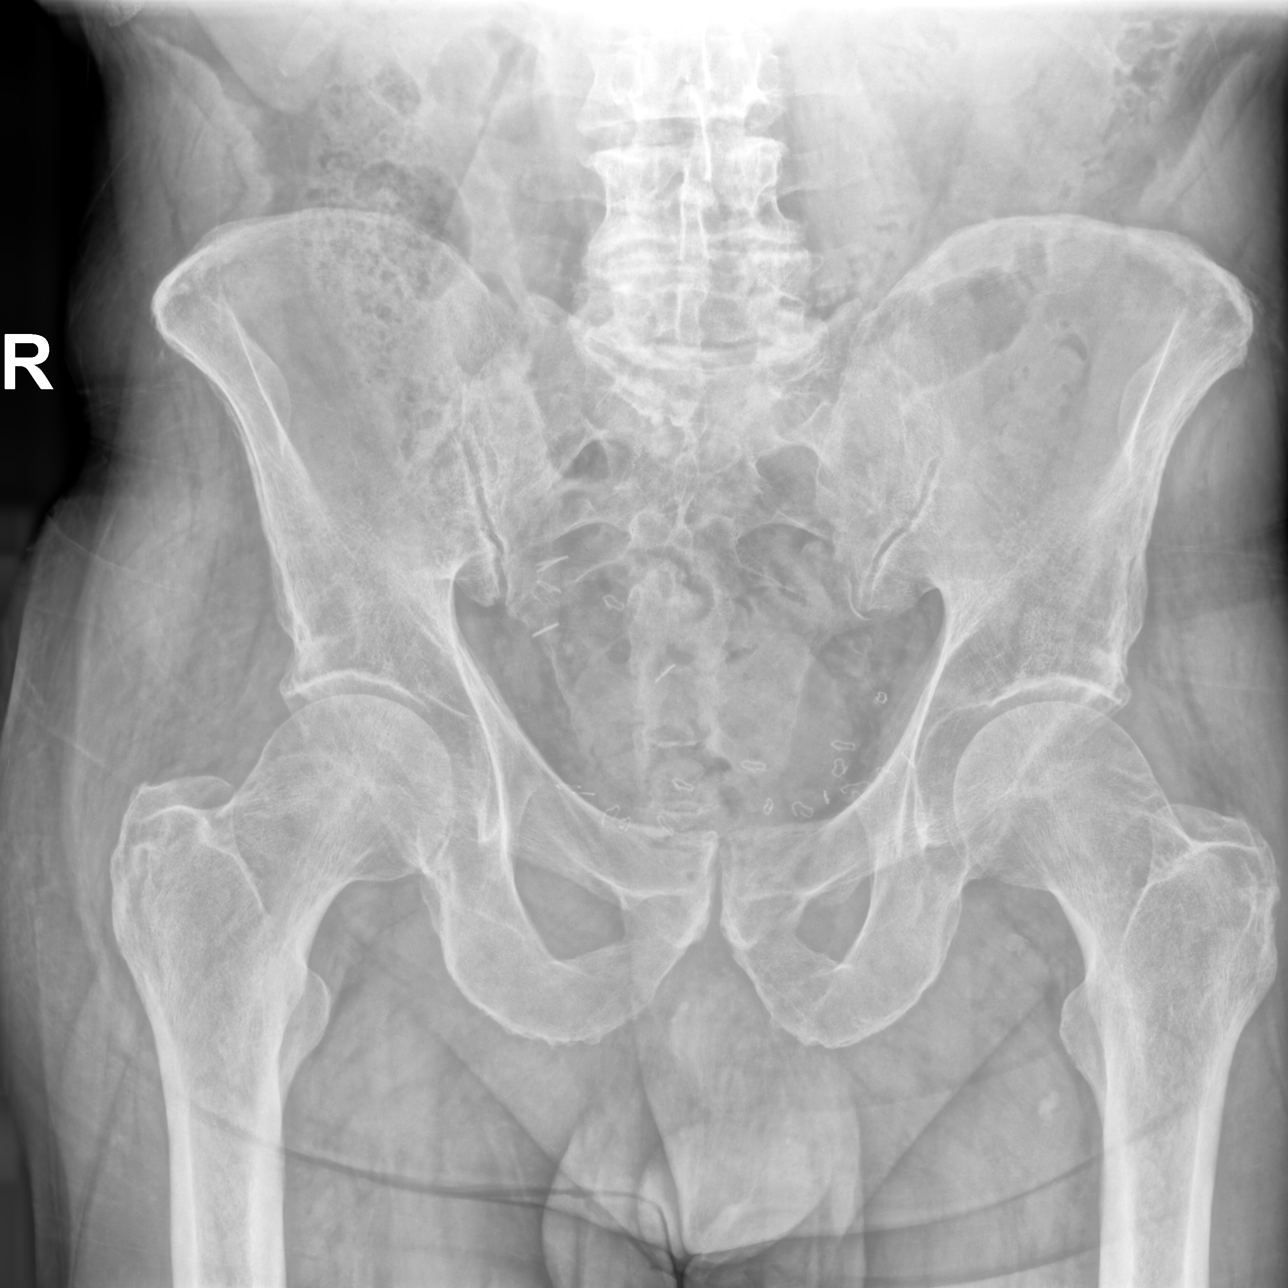

[abdomen standing ap]
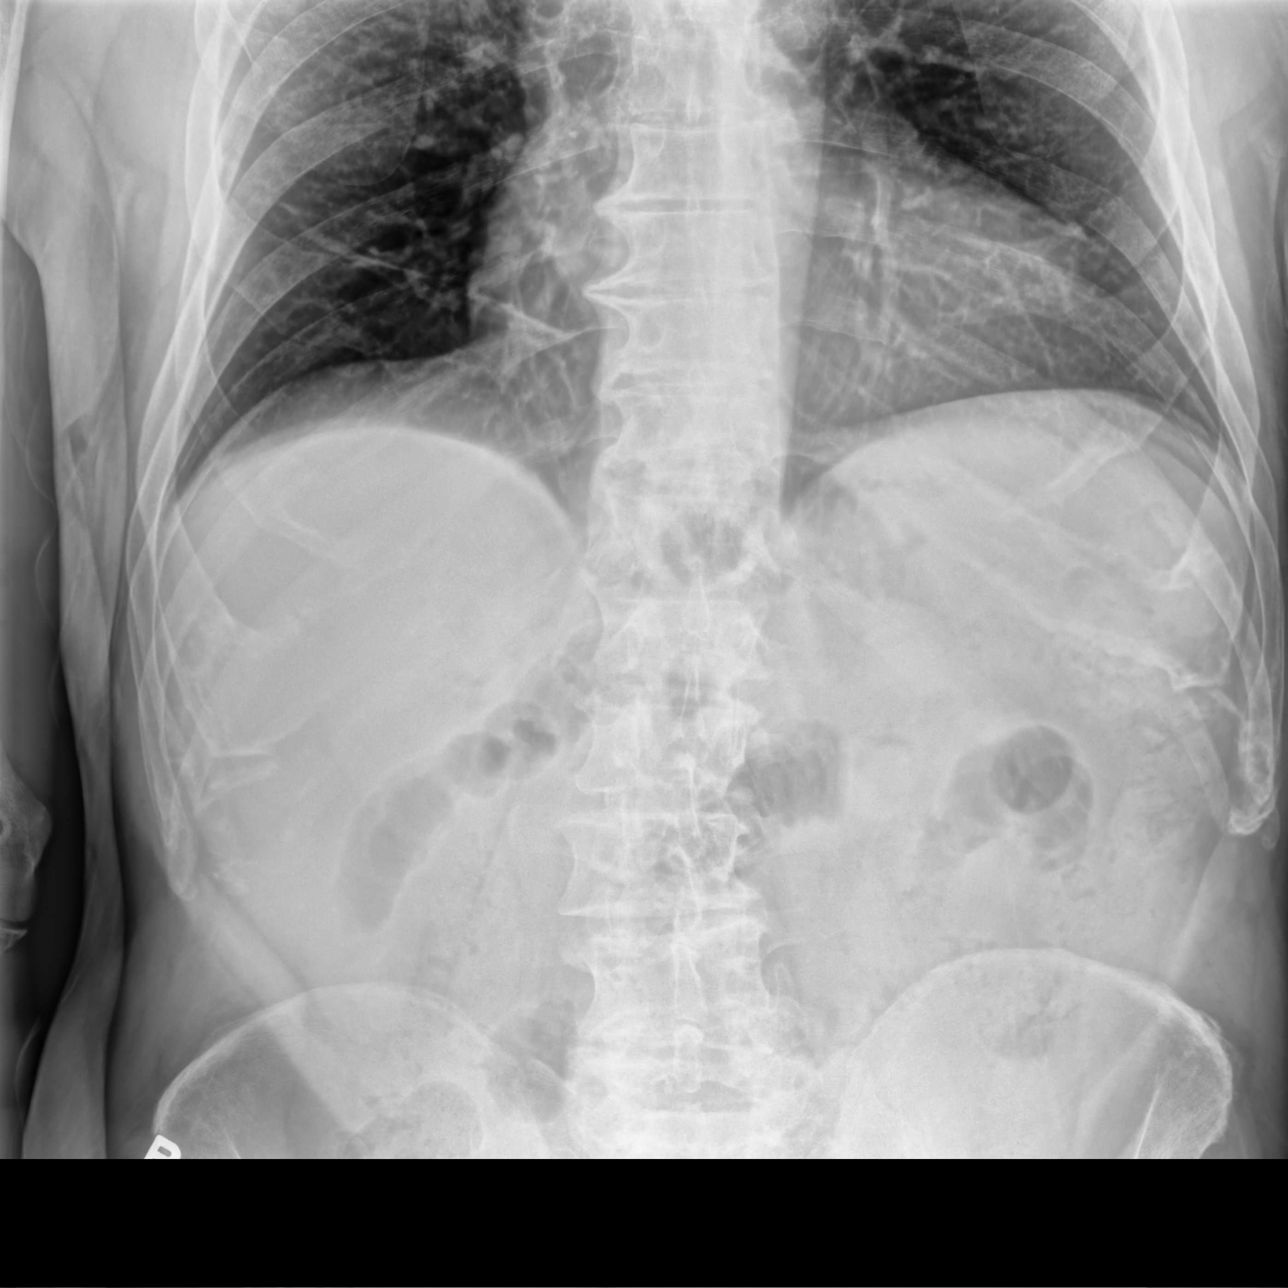

[3 of 3 positions shown; findings below may reference images not displayed]

FINDINGS: Multiple mildly dilated loops of small bowel are seen within the
left upper quadrant of the abdomen. Stool is noted throughout a
nondistended colon. Together, the findings suggest changes of a
partial mid small bowel obstruction. No free intraperitoneal gas. No
organomegaly. Surgical clips are noted within the inguinal regions
bilaterally likely related to bilateral inguinal hernia repair.
Degenerative changes are seen within the thoracolumbar spine.
IMPRESSION: Suspected partial mid small bowel obstruction. Dedicated CT imaging
or small-bowel follow-through examination would be helpful for
further evaluation.

## 2022-08-05 ENCOUNTER — Other Ambulatory Visit (INDEPENDENT_AMBULATORY_CARE_PROVIDER_SITE_OTHER): Payer: BC Managed Care – PPO

## 2022-08-05 DIAGNOSIS — E785 Hyperlipidemia, unspecified: Secondary | ICD-10-CM | POA: Diagnosis not present

## 2022-08-05 DIAGNOSIS — N289 Disorder of kidney and ureter, unspecified: Secondary | ICD-10-CM

## 2022-08-05 LAB — LIPID PANEL
Cholesterol: 145 mg/dL (ref 0–200)
HDL: 58.6 mg/dL (ref >39.00)
LDL Cholesterol: 75 mg/dL (ref 0–99)
NonHDL: 86.09
Total CHOL/HDL Ratio: 2
Triglycerides: 54 mg/dL (ref 0.0–149.0)
VLDL: 10.8 mg/dL (ref 0.0–40.0)

## 2022-08-05 LAB — BASIC METABOLIC PANEL
BUN: 19 mg/dL (ref 6–23)
CO2: 30 mEq/L (ref 19–32)
Calcium: 8.4 mg/dL (ref 8.4–10.5)
Chloride: 106 mEq/L (ref 96–112)
Creatinine, Ser: 1.28 mg/dL (ref 0.40–1.50)
GFR: 54.23 mL/min — ABNORMAL LOW (ref >60.00)
Glucose, Bld: 93 mg/dL (ref 70–99)
Potassium: 3.9 mEq/L (ref 3.5–5.1)
Sodium: 142 mEq/L (ref 135–145)

## 2022-08-09 DIAGNOSIS — H26491 Other secondary cataract, right eye: Secondary | ICD-10-CM | POA: Diagnosis not present

## 2022-08-19 DIAGNOSIS — L570 Actinic keratosis: Secondary | ICD-10-CM | POA: Diagnosis not present

## 2022-08-19 DIAGNOSIS — Z85828 Personal history of other malignant neoplasm of skin: Secondary | ICD-10-CM | POA: Diagnosis not present

## 2022-08-19 DIAGNOSIS — D2262 Melanocytic nevi of left upper limb, including shoulder: Secondary | ICD-10-CM | POA: Diagnosis not present

## 2022-08-19 DIAGNOSIS — B078 Other viral warts: Secondary | ICD-10-CM | POA: Diagnosis not present

## 2022-09-23 ENCOUNTER — Other Ambulatory Visit: Payer: Self-pay | Admitting: Primary Care

## 2022-09-23 DIAGNOSIS — J4520 Mild intermittent asthma, uncomplicated: Secondary | ICD-10-CM

## 2022-10-06 ENCOUNTER — Other Ambulatory Visit: Payer: Self-pay | Admitting: Primary Care

## 2022-10-06 DIAGNOSIS — E785 Hyperlipidemia, unspecified: Secondary | ICD-10-CM

## 2022-10-26 ENCOUNTER — Other Ambulatory Visit: Payer: Self-pay | Admitting: Primary Care

## 2022-10-26 DIAGNOSIS — N289 Disorder of kidney and ureter, unspecified: Secondary | ICD-10-CM

## 2022-11-09 ENCOUNTER — Other Ambulatory Visit (INDEPENDENT_AMBULATORY_CARE_PROVIDER_SITE_OTHER): Payer: BC Managed Care – PPO

## 2022-11-09 DIAGNOSIS — N289 Disorder of kidney and ureter, unspecified: Secondary | ICD-10-CM | POA: Diagnosis not present

## 2022-11-09 LAB — BASIC METABOLIC PANEL
BUN: 17 mg/dL (ref 6–23)
CO2: 32 mEq/L (ref 19–32)
Calcium: 9 mg/dL (ref 8.4–10.5)
Chloride: 103 mEq/L (ref 96–112)
Creatinine, Ser: 1.3 mg/dL (ref 0.40–1.50)
GFR: 53.13 mL/min — ABNORMAL LOW (ref >60.00)
Glucose, Bld: 97 mg/dL (ref 70–99)
Potassium: 5 mEq/L (ref 3.5–5.1)
Sodium: 140 mEq/L (ref 135–145)

## 2022-11-10 ENCOUNTER — Telehealth: Payer: Self-pay | Admitting: Primary Care

## 2022-11-10 ENCOUNTER — Other Ambulatory Visit: Payer: Self-pay | Admitting: Primary Care

## 2022-11-10 ENCOUNTER — Encounter: Payer: Self-pay | Admitting: *Deleted

## 2022-11-10 DIAGNOSIS — N289 Disorder of kidney and ureter, unspecified: Secondary | ICD-10-CM

## 2022-11-10 NOTE — Telephone Encounter (Signed)
See result note for further documentation.

## 2022-11-10 NOTE — Telephone Encounter (Signed)
Patient called and stated he was returning [place] call.

## 2022-11-12 ENCOUNTER — Telehealth: Payer: Self-pay | Admitting: Primary Care

## 2022-11-12 NOTE — Telephone Encounter (Signed)
Unable to reach patient. Left voicemail to return call to our office.   

## 2022-11-12 NOTE — Telephone Encounter (Signed)
Patient called and stated he has some question about what he has going and he has an ultrasound set up.  Call back number [phone]

## 2022-11-12 NOTE — Telephone Encounter (Signed)
Patient called back in and stated he believes his symptoms are possibly associated with a hernia. He stated he was typing out a Pharmacist, community message to send to [place] with his concerns. Advised I would be on the lookout for it to come through.

## 2022-11-19 ENCOUNTER — Other Ambulatory Visit: Payer: BC Managed Care – PPO

## 2022-12-01 DIAGNOSIS — K4031 Unilateral inguinal hernia, with obstruction, without gangrene, recurrent: Secondary | ICD-10-CM | POA: Diagnosis not present

## 2022-12-02 ENCOUNTER — Other Ambulatory Visit: Payer: BC Managed Care – PPO

## 2022-12-17 ENCOUNTER — Other Ambulatory Visit: Payer: Self-pay | Admitting: Primary Care

## 2022-12-17 DIAGNOSIS — J4520 Mild intermittent asthma, uncomplicated: Secondary | ICD-10-CM

## 2022-12-17 NOTE — Telephone Encounter (Signed)
Patient called in and stated he needs this medication today. He stated that he usually use something else sometimes but they are out. Thank you!

## 2023-03-15 DIAGNOSIS — Z85828 Personal history of other malignant neoplasm of skin: Secondary | ICD-10-CM | POA: Diagnosis not present

## 2023-03-15 DIAGNOSIS — D692 Other nonthrombocytopenic purpura: Secondary | ICD-10-CM | POA: Diagnosis not present

## 2023-03-15 DIAGNOSIS — L570 Actinic keratosis: Secondary | ICD-10-CM | POA: Diagnosis not present

## 2023-03-17 DIAGNOSIS — C44311 Basal cell carcinoma of skin of nose: Secondary | ICD-10-CM | POA: Diagnosis not present

## 2023-05-12 ENCOUNTER — Other Ambulatory Visit: Payer: Self-pay | Admitting: Primary Care

## 2023-05-12 DIAGNOSIS — E785 Hyperlipidemia, unspecified: Secondary | ICD-10-CM

## 2023-05-23 ENCOUNTER — Ambulatory Visit (INDEPENDENT_AMBULATORY_CARE_PROVIDER_SITE_OTHER): Payer: BC Managed Care – PPO | Admitting: Primary Care

## 2023-05-23 ENCOUNTER — Encounter: Payer: Self-pay | Admitting: Primary Care

## 2023-05-23 VITALS — BP 144/78 | HR 65 | Temp 97.3°F | Ht 75.0 in | Wt 212.0 lb

## 2023-05-23 DIAGNOSIS — J4520 Mild intermittent asthma, uncomplicated: Secondary | ICD-10-CM

## 2023-05-23 DIAGNOSIS — E785 Hyperlipidemia, unspecified: Secondary | ICD-10-CM

## 2023-05-23 DIAGNOSIS — Z1211 Encounter for screening for malignant neoplasm of colon: Secondary | ICD-10-CM

## 2023-05-23 DIAGNOSIS — Z125 Encounter for screening for malignant neoplasm of prostate: Secondary | ICD-10-CM | POA: Diagnosis not present

## 2023-05-23 DIAGNOSIS — Z0000 Encounter for general adult medical examination without abnormal findings: Secondary | ICD-10-CM | POA: Diagnosis not present

## 2023-05-23 LAB — COMPREHENSIVE METABOLIC PANEL
ALT: 14 U/L (ref 0–53)
AST: 14 U/L (ref 0–37)
Albumin: 3.9 g/dL (ref 3.5–5.2)
Alkaline Phosphatase: 61 U/L (ref 39–117)
BUN: 20 mg/dL (ref 6–23)
CO2: 32 mEq/L (ref 19–32)
Calcium: 9 mg/dL (ref 8.4–10.5)
Chloride: 106 mEq/L (ref 96–112)
Creatinine, Ser: 1.23 mg/dL (ref 0.40–1.50)
GFR: 56.57 mL/min — ABNORMAL LOW (ref >60.00)
Glucose, Bld: 94 mg/dL (ref 70–99)
Potassium: 4.8 mEq/L (ref 3.5–5.1)
Sodium: 141 mEq/L (ref 135–145)
Total Bilirubin: 1.2 mg/dL (ref 0.2–1.2)
Total Protein: 6.1 g/dL (ref 6.0–8.3)

## 2023-05-23 LAB — LIPID PANEL
Cholesterol: 143 mg/dL (ref 0–200)
HDL: 62 mg/dL (ref >39.00)
LDL Cholesterol: 71 mg/dL (ref 0–99)
NonHDL: 80.63
Total CHOL/HDL Ratio: 2
Triglycerides: 47 mg/dL (ref 0.0–149.0)
VLDL: 9.4 mg/dL (ref 0.0–40.0)

## 2023-05-23 LAB — PSA, MEDICARE: PSA: 1.17 ng/ml (ref 0.10–4.00)

## 2023-05-23 LAB — HEMOGLOBIN A1C: Hgb A1c MFr Bld: 5.4 % (ref 4.6–6.5)

## 2023-05-23 NOTE — Patient Instructions (Signed)
Stop by the lab prior to leaving today. I will notify you of your results once received.   Complete the Cologuard kit as discussed.  It was a pleasure to see you today!

## 2023-05-23 NOTE — Assessment & Plan Note (Signed)
Repeat lipid panel pending. Continue atorvastatin 20 mg daily. 

## 2023-05-23 NOTE — Assessment & Plan Note (Signed)
Controlled. Irregular use of albuterol inhaler. Continue to monitor.

## 2023-05-23 NOTE — Progress Notes (Signed)
Subjective:    Patient ID: BLADE SCHEFF, male    DOB: 29-Apr-1946, 77 y.o.   MRN: [id number]  HPI  Stephen Patterson is a very pleasant 77 y.o. male who presents today for complete physical and follow up of chronic conditions.  Immunizations: -Tetanus: Completed > 10 years ago. Declines  -Shingles: Completed Shingrix series -Pneumonia: Completed Prevnar 20 in 2022  Diet: Fair diet.  Exercise: Regular exercise at the gym.  Eye exam: Completes annually  Dental exam: Completes semi-annually    Colonoscopy: Completed in February 2014, due 2024  PSA: Due  BP Readings from Last 3 Encounters:  05/23/23 (!) 144/78  04/15/22 118/80  09/22/21 140/82      Review of Systems  Constitutional:  Negative for unexpected weight change.  HENT:  Negative for rhinorrhea.   Respiratory:  Negative for cough and shortness of breath.   Cardiovascular:  Negative for chest pain.  Gastrointestinal:  Negative for constipation and diarrhea.  Genitourinary:  Negative for difficulty urinating.  Musculoskeletal:  Negative for arthralgias and myalgias.  Skin:  Negative for rash.  Allergic/Immunologic: Negative for environmental allergies.  Neurological:  Negative for dizziness and headaches.  Psychiatric/Behavioral:  The patient is not nervous/anxious.          Past Medical History:  Diagnosis Date   Abdominal bloating 09/22/2021   Asthma    COVID-19 virus infection    Diverticulosis    Inguinal hernia    Osteoarthritis of right knee     Social History   Socioeconomic History   Marital status: Married    Spouse name: Not on file   Number of children: 2   Years of education: Not on file   Highest education level: Not on file  Occupational History   Occupation: Technical sales engineer  Tobacco Use   Smoking status: Never   Smokeless tobacco: Never  Vaping Use   Vaping Use: Never used  Substance and Sexual Activity   Alcohol use: Yes    Alcohol/week: 0.0 standard drinks of alcohol     Comment: 2 per week   Drug use: No   Sexual activity: Yes  Other Topics Concern   Not on file  Social History Narrative   Married.   2 children. 3 grandchildren.   Works as a Chief Strategy Officer   Exercise: Yes   Enjoys playing golf, spending time on his farm.    Social Determinants of Health   Financial Resource Strain: Not on file  Food Insecurity: Not on file  Transportation Needs: Not on file  Physical Activity: Not on file  Stress: Not on file  Social Connections: Not on file  Intimate Partner Violence: Not on file    Past Surgical History:  Procedure Laterality Date   INGUINAL HERNIA REPAIR  [phone]   REPLACEMENT TOTAL KNEE Right 09/2019    Family History  Problem Relation Age of Onset   Cancer Mother        breast   Heart disease Father    Lymphoma Sister    Colon cancer Unknown 12       niece    Allergies  Allergen Reactions   Penicillin G     Other reaction(s): Unknown   Penicillins     As a child unsure of reaction    Current Outpatient Medications on File Prior to Visit  Medication Sig Dispense Refill   albuterol (VENTOLIN HFA) 108 (90 Base) MCG/ACT inhaler INHALE 1 TO 2 PUFFS  BY MOUTH DAILY 6 HOURS BEFORE EXERCISE AS NEEDED 6.7 g 0   atorvastatin (LIPITOR) 20 MG tablet TAKE 1 TABLET(20 MG) BY MOUTH DAILY FOR CHOLESTEROL 30 tablet 0   No current facility-administered medications on file prior to visit.    BP (!) 144/78   Pulse 65   Temp (!) 97.3 F (36.3 C) (Temporal)   Ht 6\' 3"  (1.905 m)   Wt 212 lb (96.2 kg)   SpO2 96%   BMI 26.50 kg/m  Objective:   Physical Exam HENT:     Right Ear: Tympanic membrane and ear canal normal.     Left Ear: Tympanic membrane and ear canal normal.     Nose: Nose normal.     Right Sinus: No maxillary sinus tenderness or frontal sinus tenderness.     Left Sinus: No maxillary sinus tenderness or frontal sinus tenderness.  Eyes:     Conjunctiva/sclera: Conjunctivae normal.  Neck:      Thyroid: No thyromegaly.     Vascular: No carotid bruit.  Cardiovascular:     Rate and Rhythm: Normal rate and regular rhythm.     Heart sounds: Normal heart sounds.  Pulmonary:     Effort: Pulmonary effort is normal.     Breath sounds: Normal breath sounds. No wheezing or rales.  Abdominal:     General: Bowel sounds are normal.     Palpations: Abdomen is soft.     Tenderness: There is no abdominal tenderness.  Musculoskeletal:        General: Normal range of motion.     Cervical back: Neck supple.  Skin:    General: Skin is warm and dry.  Neurological:     Mental Status: He is alert and oriented to person, place, and time.     Cranial Nerves: No cranial nerve deficit.     Deep Tendon Reflexes: Reflexes are normal and symmetric.  Psychiatric:        Mood and Affect: Mood normal.           Assessment & Plan:  Preventative health care Assessment & Plan: Declines Tetanus vaccine. Colonoscopy due, he is 69 but is very active given his age. He opts for Cologuard.  PSA due and pending.  Discussed the importance of a healthy diet and regular exercise in order for weight loss, and to reduce the risk of further co-morbidity.  Exam stable. Labs pending.  Follow up in 1 year for repeat physical.    Hyperlipidemia, unspecified hyperlipidemia type Assessment & Plan: Repeat lipid panel pending.  Continue atorvastatin 20 mg daily.   Orders: -     Lipid panel -     Comprehensive metabolic panel -     Hemoglobin A1c  Mild intermittent asthma without complication Assessment & Plan: Controlled. Irregular use of albuterol inhaler. Continue to monitor.   Screening for colon cancer -     Cologuard  Screening for prostate cancer -     PSA, Medicare        Doreene Nest, NP

## 2023-05-23 NOTE — Assessment & Plan Note (Signed)
Declines Tetanus vaccine. Colonoscopy due, he is 79 but is very active given his age. He opts for Cologuard.  PSA due and pending.  Discussed the importance of a healthy diet and regular exercise in order for weight loss, and to reduce the risk of further co-morbidity.  Exam stable. Labs pending.  Follow up in 1 year for repeat physical.

## 2023-06-13 DIAGNOSIS — Z1211 Encounter for screening for malignant neoplasm of colon: Secondary | ICD-10-CM | POA: Diagnosis not present

## 2023-06-19 LAB — COLOGUARD: COLOGUARD: NEGATIVE

## 2023-07-09 ENCOUNTER — Other Ambulatory Visit: Payer: Self-pay | Admitting: Primary Care

## 2023-07-09 DIAGNOSIS — E785 Hyperlipidemia, unspecified: Secondary | ICD-10-CM

## 2023-08-15 DIAGNOSIS — Z961 Presence of intraocular lens: Secondary | ICD-10-CM | POA: Diagnosis not present

## 2023-08-15 DIAGNOSIS — H26492 Other secondary cataract, left eye: Secondary | ICD-10-CM | POA: Diagnosis not present

## 2023-08-15 DIAGNOSIS — H524 Presbyopia: Secondary | ICD-10-CM | POA: Diagnosis not present

## 2023-08-22 ENCOUNTER — Encounter: Payer: Self-pay | Admitting: Gastroenterology

## 2023-10-10 ENCOUNTER — Encounter: Payer: Self-pay | Admitting: Gastroenterology

## 2023-10-17 DIAGNOSIS — D2262 Melanocytic nevi of left upper limb, including shoulder: Secondary | ICD-10-CM | POA: Diagnosis not present

## 2023-10-17 DIAGNOSIS — L570 Actinic keratosis: Secondary | ICD-10-CM | POA: Diagnosis not present

## 2023-10-17 DIAGNOSIS — C44519 Basal cell carcinoma of skin of other part of trunk: Secondary | ICD-10-CM | POA: Diagnosis not present

## 2023-10-17 DIAGNOSIS — D224 Melanocytic nevi of scalp and neck: Secondary | ICD-10-CM | POA: Diagnosis not present

## 2023-10-17 DIAGNOSIS — L821 Other seborrheic keratosis: Secondary | ICD-10-CM | POA: Diagnosis not present

## 2023-10-17 DIAGNOSIS — Z85828 Personal history of other malignant neoplasm of skin: Secondary | ICD-10-CM | POA: Diagnosis not present

## 2024-08-02 ENCOUNTER — Other Ambulatory Visit: Payer: Self-pay | Admitting: Primary Care

## 2024-08-02 DIAGNOSIS — E785 Hyperlipidemia, unspecified: Secondary | ICD-10-CM

## 2024-08-02 NOTE — Telephone Encounter (Signed)
 Patient is due for CPE/follow up, this will be required prior to any further refills.  Please schedule, thank you!

## 2024-08-03 NOTE — Telephone Encounter (Signed)
 I called patient and he has been scheduled.

## 2024-08-16 ENCOUNTER — Encounter: Payer: Self-pay | Admitting: Primary Care

## 2024-08-16 ENCOUNTER — Ambulatory Visit (INDEPENDENT_AMBULATORY_CARE_PROVIDER_SITE_OTHER): Admitting: Primary Care

## 2024-08-16 ENCOUNTER — Ambulatory Visit: Payer: Self-pay | Admitting: Primary Care

## 2024-08-16 VITALS — BP 146/72 | HR 58 | Temp 97.8°F | Ht 75.0 in | Wt 216.0 lb

## 2024-08-16 DIAGNOSIS — Z125 Encounter for screening for malignant neoplasm of prostate: Secondary | ICD-10-CM

## 2024-08-16 DIAGNOSIS — J4520 Mild intermittent asthma, uncomplicated: Secondary | ICD-10-CM

## 2024-08-16 DIAGNOSIS — E785 Hyperlipidemia, unspecified: Secondary | ICD-10-CM | POA: Diagnosis not present

## 2024-08-16 DIAGNOSIS — Z23 Encounter for immunization: Secondary | ICD-10-CM | POA: Diagnosis not present

## 2024-08-16 DIAGNOSIS — R7989 Other specified abnormal findings of blood chemistry: Secondary | ICD-10-CM

## 2024-08-16 DIAGNOSIS — Z0000 Encounter for general adult medical examination without abnormal findings: Secondary | ICD-10-CM | POA: Diagnosis not present

## 2024-08-16 LAB — COMPREHENSIVE METABOLIC PANEL WITH GFR
ALT: 13 U/L (ref 0–53)
AST: 14 U/L (ref 0–37)
Albumin: 4 g/dL (ref 3.5–5.2)
Alkaline Phosphatase: 57 U/L (ref 39–117)
BUN: 17 mg/dL (ref 6–23)
CO2: 31 meq/L (ref 19–32)
Calcium: 8.7 mg/dL (ref 8.4–10.5)
Chloride: 102 meq/L (ref 96–112)
Creatinine, Ser: 1.15 mg/dL (ref 0.40–1.50)
GFR: 60.8 mL/min (ref >60.00)
Glucose, Bld: 86 mg/dL (ref 70–99)
Potassium: 4.2 meq/L (ref 3.5–5.1)
Sodium: 138 meq/L (ref 135–145)
Total Bilirubin: 1.1 mg/dL (ref 0.2–1.2)
Total Protein: 5.9 g/dL — ABNORMAL LOW (ref 6.0–8.3)

## 2024-08-16 LAB — LIPID PANEL
Cholesterol: 175 mg/dL (ref 0–200)
HDL: 66.3 mg/dL (ref >39.00)
LDL Cholesterol: 100 mg/dL — ABNORMAL HIGH (ref 0–99)
NonHDL: 108.81
Total CHOL/HDL Ratio: 3
Triglycerides: 46 mg/dL (ref 0.0–149.0)
VLDL: 9.2 mg/dL (ref 0.0–40.0)

## 2024-08-16 LAB — CBC
HCT: 43 % (ref 39.0–52.0)
Hemoglobin: 14.7 g/dL (ref 13.0–17.0)
MCHC: 34.2 g/dL (ref 30.0–36.0)
MCV: 87.7 fl (ref 78.0–100.0)
Platelets: 149 K/uL — ABNORMAL LOW (ref 150.0–400.0)
RBC: 4.91 Mil/uL (ref 4.22–5.81)
RDW: 13 % (ref 11.5–15.5)
WBC: 4.2 K/uL (ref 4.0–10.5)

## 2024-08-16 LAB — PSA: PSA: 3.2 ng/mL (ref 0.10–4.00)

## 2024-08-16 MED ORDER — COVID-19 MRNA VAC-TRIS(PFIZER) 30 MCG/0.3ML IM SUSY: 0.3000 mL | PREFILLED_SYRINGE | Freq: Once | INTRAMUSCULAR | 0 refills | Status: AC

## 2024-08-16 MED ORDER — ATORVASTATIN CALCIUM 20 MG PO TABS: 20.0000 mg | ORAL_TABLET | Freq: Every day | ORAL | 3 refills | Status: AC

## 2024-08-16 NOTE — Patient Instructions (Signed)
 Stop by the lab prior to leaving today. I will notify you of your results once received.   It was a pleasure to see you today!

## 2024-08-16 NOTE — Assessment & Plan Note (Signed)
 Repeat lipid panel pending. Continue atorvastatin 40 mg daily.

## 2024-08-16 NOTE — Assessment & Plan Note (Signed)
 Influenza vaccine provided today.  Colon cancer screening up-to-date, no further screening given age. Discussed that PSA screening at 74, he would like to continue screenings.  PSA pending.  Discussed the importance of a healthy diet and regular exercise in order for weight loss, and to reduce the risk of further co-morbidity.  Exam stable. Labs pending.  Follow up in 1 year for repeat physical.

## 2024-08-16 NOTE — Progress Notes (Signed)
 Subjective:    Patient ID: Stephen Patterson, male    DOB: Mar 19, 1946, 78 y.o.   MRN: [id number]  Stephen Patterson is a very pleasant 78 y.o. male who presents today for complete physical and follow up of chronic conditions.  Immunizations:  -Influenza: Influenza vaccine provided today.  -Shingles: Completed Shingrix series -Pneumonia: Completed Prevnar 20 in 2022  Diet: Fair diet.  Exercise: No regular exercise.  Eye exam: Completes annually  Dental exam: Completes semi-annually    Colonoscopy: Completed in 2014, due 2024 and has yet to complete. Completed Cologuard in 2024.  PSA: He would like screening.      BP Readings from Last 3 Encounters:  08/16/24 (!) 146/72  05/23/23 (!) 144/78  04/15/22 118/80   Wt Readings from Last 3 Encounters:  08/16/24 216 lb (98 kg)  05/23/23 212 lb (96.2 kg)  04/15/22 210 lb 9.6 oz (95.5 kg)      Review of Systems  Constitutional:  Negative for unexpected weight change.  HENT:  Negative for rhinorrhea.   Respiratory:  Negative for cough and shortness of breath.   Cardiovascular:  Negative for chest pain.  Gastrointestinal:  Negative for constipation and diarrhea.  Genitourinary:  Negative for difficulty urinating.  Musculoskeletal:  Negative for arthralgias and myalgias.  Skin:  Negative for rash.  Allergic/Immunologic: Negative for environmental allergies.  Neurological:  Negative for dizziness and headaches.  Psychiatric/Behavioral:  The patient is not nervous/anxious.          Past Medical History:  Diagnosis Date   Abdominal bloating 09/22/2021   Asthma    COVID-19 virus infection    Diverticulosis    Inguinal hernia    Osteoarthritis of right knee    Tingling pain 08/11/2020    Social History   Socioeconomic History   Marital status: Married    Spouse name: Not on file   Number of children: 2   Years of education: Not on file   Highest education level: Not on file  Occupational History   Occupation: Holiday representative  Tobacco Use   Smoking status: Never   Smokeless tobacco: Never  Vaping Use   Vaping status: Never Used  Substance and Sexual Activity   Alcohol use: Yes    Alcohol/week: 0.0 standard drinks of alcohol    Comment: 2 per week   Drug use: No   Sexual activity: Yes  Other Topics Concern   Not on file  Social History Narrative   Married.   2 children. 3 grandchildren.   Works as a Chief Strategy Officer   Exercise: Yes   Enjoys playing golf, spending time on his farm.    Social Drivers of Corporate investment banker Strain: Low Risk  (08/13/2024)   Overall Financial Resource Strain (CARDIA)    Difficulty of Paying Living Expenses: Not hard at all  Food Insecurity: Unknown (08/13/2024)   Hunger Vital Sign    Worried About Running Out of Food in the Last Year: Not on file    Ran Out of Food in the Last Year: Never true  Transportation Needs: Unknown (08/13/2024)   PRAPARE - Administrator, Civil Service (Medical): No    Lack of Transportation (Non-Medical): Not on file  Physical Activity: Sufficiently Active (08/13/2024)   Exercise Vital Sign    Days of Exercise per Week: 4 days    Minutes of Exercise per Session: 50 min  Stress: No Stress Concern Present (08/13/2024)  [employer] of Occupational Health - Occupational Stress Questionnaire    Feeling of Stress: Only a little  Social Connections: Socially Integrated (08/13/2024)   Social Connection and Isolation Panel    Frequency of Communication with Friends and Family: Three times a week    Frequency of Social Gatherings with Friends and Family: More than three times a week    Attends Religious Services: 1 to 4 times per year    Active Member of [employer] or Organizations: Yes    Attends Banker Meetings: Not on file    Marital Status: Married  Catering manager Violence: Not on file    Past Surgical History:  Procedure Laterality Date   INGUINAL HERNIA REPAIR  [phone]    REPLACEMENT TOTAL KNEE Right 09/2019    Family History  Problem Relation Age of Onset   Cancer Mother        breast   Heart disease Father    Lymphoma Sister    Colon cancer Niece 30       niece    Allergies  Allergen Reactions   Penicillin G     Other reaction(s): Unknown   Penicillins     As a child unsure of reaction    Current Outpatient Medications on File Prior to Visit  Medication Sig Dispense Refill   albuterol  (VENTOLIN  HFA) 108 (90 Base) MCG/ACT inhaler INHALE 1 TO 2 PUFFS BY MOUTH DAILY 6 HOURS BEFORE EXERCISE AS NEEDED (Patient not taking: Reported on 08/16/2024) 6.7 g 0   No current facility-administered medications on file prior to visit.    BP (!) 146/72   Pulse (!) 58   Temp 97.8 F (36.6 C) (Temporal)   Ht 6' 3 (1.905 m)   Wt 216 lb (98 kg)   SpO2 96%   BMI 27.00 kg/m  Objective:   Physical Exam HENT:     Right Ear: Tympanic membrane and ear canal normal.     Left Ear: Tympanic membrane and ear canal normal.  Eyes:     Pupils: Pupils are equal, round, and reactive to light.  Cardiovascular:     Rate and Rhythm: Normal rate and regular rhythm.  Pulmonary:     Effort: Pulmonary effort is normal.     Breath sounds: Normal breath sounds.  Abdominal:     General: Bowel sounds are normal.     Palpations: Abdomen is soft.     Tenderness: There is no abdominal tenderness.  Musculoskeletal:        General: Normal range of motion.     Cervical back: Neck supple.  Skin:    General: Skin is warm and dry.  Neurological:     Mental Status: He is alert and oriented to person, place, and time.     Cranial Nerves: No cranial nerve deficit.     Deep Tendon Reflexes:     Reflex Scores:      Patellar reflexes are 2+ on the right side and 2+ on the left side. Psychiatric:        Mood and Affect: Mood normal.     Physical Exam        Assessment & Plan:  Preventative health care Assessment & Plan: Influenza vaccine provided today.  Colon  cancer screening up-to-date, no further screening given age. Discussed that PSA screening at 74, he would like to continue screenings.  PSA pending.  Discussed the importance of a healthy diet and regular exercise in order for weight loss, and to reduce  the risk of further co-morbidity.  Exam stable. Labs pending.  Follow up in 1 year for repeat physical.    Mild intermittent asthma without complication Assessment & Plan: Controlled.  No recent use of albuterol  inhaler.  Continue to monitor.    Hyperlipidemia, unspecified hyperlipidemia type Assessment & Plan: Repeat lipid panel pending.  Continue atorvastatin  40 mg daily.  Orders: -     Atorvastatin  Calcium ; Take 1 tablet (20 mg total) by mouth daily. for cholesterol.  Dispense: 90 tablet; Refill: 3 -     Lipid panel -     Comprehensive metabolic panel with GFR -     CBC  Screening for prostate cancer -     PSA  Need for COVID-19 vaccine -     COVID-19 mRNA Vac-TriS(Pfizer); Inject 0.3 mLs into the muscle once for 1 dose.  Dispense: 0.3 mL; Refill: 0    Assessment and Plan Assessment & Plan         Comer MARLA Gaskins, NP     History of Present Illness

## 2024-08-16 NOTE — Assessment & Plan Note (Signed)
 Controlled.  No recent use of albuterol  inhaler.  Continue to monitor.

## 2024-08-30 DIAGNOSIS — Z961 Presence of intraocular lens: Secondary | ICD-10-CM | POA: Diagnosis not present

## 2024-08-30 DIAGNOSIS — H26492 Other secondary cataract, left eye: Secondary | ICD-10-CM | POA: Diagnosis not present

## 2024-09-20 DIAGNOSIS — H26492 Other secondary cataract, left eye: Secondary | ICD-10-CM | POA: Diagnosis not present

## 2024-10-31 ENCOUNTER — Ambulatory Visit: Admitting: Primary Care

## 2024-11-06 DIAGNOSIS — L570 Actinic keratosis: Secondary | ICD-10-CM | POA: Diagnosis not present

## 2024-11-06 DIAGNOSIS — L821 Other seborrheic keratosis: Secondary | ICD-10-CM | POA: Diagnosis not present

## 2024-11-06 DIAGNOSIS — Z85828 Personal history of other malignant neoplasm of skin: Secondary | ICD-10-CM | POA: Diagnosis not present

## 2024-11-06 DIAGNOSIS — C44622 Squamous cell carcinoma of skin of right upper limb, including shoulder: Secondary | ICD-10-CM | POA: Diagnosis not present

## 2024-11-13 ENCOUNTER — Ambulatory Visit: Admitting: Primary Care

## 2024-11-13 ENCOUNTER — Ambulatory Visit: Payer: Self-pay

## 2024-11-13 NOTE — Telephone Encounter (Signed)
°  FYI Only or Action Required?: FYI only for provider: appointment scheduled on 11/15/2024.  Patient was last seen in primary care on 08/16/2024 by Gretta Comer POUR, NP.  Called Nurse Triage reporting Toe Pain.  Symptoms began several days ago.  Interventions attempted: Other: soaking.  Symptoms are: unchanged.  Triage Disposition: See PCP When Office is Open (Within 3 Days)  Patient/caregiver understands and will follow disposition?: Yes Copied from CRM #[id number]. Topic: Clinical - Red Word Triage >> Nov 13, 2024  3:52 PM Drema MATSU wrote: Red Word that prompted transfer to Nurse Triage: Patient has an ingrown toenail on his left big toe. He is now experiencing pain, and it is hurting on the bottom of foot. Reason for Disposition  [1] MODERATE pain (e.g., limping, interferes with normal activities) AND [2] present > 3 days  Answer Assessment - Initial Assessment Questions 1. LOCATION: Which toe?      Left big toe on inside 2. APPEARANCE: What does it look like?      Slight redness 3. ONSET: When did it start?      4 days ago 4. PAIN: Is there any pain? If Yes, ask: How bad is the pain?   (Scale 1-10; or mild, moderate, severe)     3/10 5. REDNESS: Is there any redness of the skin? If Yes, ask: How much of the toe is red?     denies 6. OTHER SYMPTOMS: Do you have any other symptoms? (e.g., chills, fever, red streak up foot)     Denies  Called CAL, Tamara, to confirm that ingrown toenails are treated in office  Protocols used: Toenail - Ingrown-A-AH

## 2024-11-14 ENCOUNTER — Ambulatory Visit: Admitting: Primary Care

## 2024-11-14 NOTE — Telephone Encounter (Signed)
 Noted. Agree with nursing triage decision. Appreciate Dr Sherrel evaluation.

## 2024-11-15 ENCOUNTER — Ambulatory Visit (INDEPENDENT_AMBULATORY_CARE_PROVIDER_SITE_OTHER): Admitting: Family Medicine

## 2024-11-15 ENCOUNTER — Encounter: Payer: Self-pay | Admitting: Family Medicine

## 2024-11-15 VITALS — BP 140/88 | HR 67 | Temp 97.7°F | Ht 75.0 in | Wt 219.2 lb

## 2024-11-15 DIAGNOSIS — L600 Ingrowing nail: Secondary | ICD-10-CM | POA: Insufficient documentation

## 2024-11-15 NOTE — Progress Notes (Signed)
 Patient ID: Stephen Patterson, male    DOB: 02/27/1946, 78 y.o.   MRN: [id number]  This visit was conducted in person.  BP (!) 140/88   Pulse 67   Temp 97.7 F (36.5 C) (Temporal)   Ht 6' 3 (1.905 m)   Wt 219 lb 4 oz (99.5 kg)   SpO2 95%   BMI 27.40 kg/m    CC:  Chief Complaint  Patient presents with   Ingrown Toenail    Left Big Toe    Subjective:   HPI: Stephen Patterson is a 78 y.o. male presenting on 11/15/2024 for Ingrown Toenail (Left Big Toe)   Left great toenail present for a while but worse in last week...  He has been putting cotton under nail.. pain increased.  Last night using tweezers and removed nail shard... pain is dramatically better today.  Has take old doxycycline in last 3 days.  No discharge.   No fever.   Has seen podiatrist in past for similar.   Has had partial removal in past.   History of cellulitis.   No past MRSA.   Relevant past medical, surgical, family and social history reviewed and updated as indicated. Interim medical history since our last visit reviewed. Allergies and medications reviewed and updated. Outpatient Medications Prior to Visit  Medication Sig Dispense Refill   atorvastatin  (LIPITOR) 20 MG tablet Take 1 tablet (20 mg total) by mouth daily. for cholesterol. 90 tablet 3   albuterol  (VENTOLIN  HFA) 108 (90 Base) MCG/ACT inhaler INHALE 1 TO 2 PUFFS BY MOUTH DAILY 6 HOURS BEFORE EXERCISE AS NEEDED (Patient not taking: Reported on 08/16/2024) 6.7 g 0   No facility-administered medications prior to visit.     Per HPI unless specifically indicated in ROS section below Review of Systems  Constitutional:  Negative for fatigue and fever.  HENT:  Negative for ear pain.   Eyes:  Negative for pain.  Respiratory:  Negative for cough and shortness of breath.   Cardiovascular:  Negative for chest pain, palpitations and leg swelling.  Gastrointestinal:  Negative for abdominal pain.  Genitourinary:  Negative for dysuria.   Musculoskeletal:  Negative for arthralgias.  Neurological:  Negative for syncope, light-headedness and headaches.  Psychiatric/Behavioral:  Negative for dysphoric mood.    Objective:  BP (!) 140/88   Pulse 67   Temp 97.7 F (36.5 C) (Temporal)   Ht 6' 3 (1.905 m)   Wt 219 lb 4 oz (99.5 kg)   SpO2 95%   BMI 27.40 kg/m   Wt Readings from Last 3 Encounters:  11/15/24 219 lb 4 oz (99.5 kg)  08/16/24 216 lb (98 kg)  05/23/23 212 lb (96.2 kg)      Physical Exam Constitutional:      Appearance: He is well-developed.  HENT:     Head: Normocephalic.     Right Ear: Hearing normal.     Left Ear: Hearing normal.     Nose: Nose normal.  Neck:     Thyroid: No thyroid mass or thyromegaly.     Vascular: No carotid bruit.     Trachea: Trachea normal.  Cardiovascular:     Rate and Rhythm: Normal rate and regular rhythm.     Pulses: Normal pulses.     Heart sounds: Heart sounds not distant. No murmur heard.    No friction rub. No gallop.     Comments: No peripheral edema Pulmonary:     Effort: Pulmonary effort is normal. No respiratory  distress.     Breath sounds: Normal breath sounds.  Feet:     Left foot:     Toenail Condition: Left toenails are ingrown.     Comments:  Slight erythema at left medial toenail edge on great toenail edge, no tenderness Skin:    General: Skin is warm and dry.     Findings: No rash.  Psychiatric:        Speech: Speech normal.        Behavior: Behavior normal.        Thought Content: Thought content normal.       Results for orders placed or performed in visit on 08/16/24  Lipid panel   Collection Time: 08/16/24  9:10 AM  Result Value Ref Range   Cholesterol 175 0 - 200 mg/dL   Triglycerides 53.9 0.0 - 149.0 mg/dL   HDL 33.69 >60.99 mg/dL   VLDL 9.2 0.0 - 59.9 mg/dL   LDL Cholesterol 899 (H) 0 - 99 mg/dL   Total CHOL/HDL Ratio 3    NonHDL 108.81   Comprehensive metabolic panel with GFR   Collection Time: 08/16/24  9:10 AM  Result Value  Ref Range   Sodium 138 135 - 145 mEq/L   Potassium 4.2 3.5 - 5.1 mEq/L   Chloride 102 96 - 112 mEq/L   CO2 31 19 - 32 mEq/L   Glucose, Bld 86 70 - 99 mg/dL   BUN 17 6 - 23 mg/dL   Creatinine, Ser 8.84 0.40 - 1.50 mg/dL   Total Bilirubin 1.1 0.2 - 1.2 mg/dL   Alkaline Phosphatase 57 39 - 117 U/L   AST 14 0 - 37 U/L   ALT 13 0 - 53 U/L   Total Protein 5.9 (L) 6.0 - 8.3 g/dL   Albumin 4.0 3.5 - 5.2 g/dL   GFR 39.19 >39.99 mL/min   Calcium  8.7 8.4 - 10.5 mg/dL  CBC   Collection Time: 08/16/24  9:10 AM  Result Value Ref Range   WBC 4.2 4.0 - 10.5 K/uL   RBC 4.91 4.22 - 5.81 Mil/uL   Platelets 149.0 (L) 150.0 - 400.0 K/uL   Hemoglobin 14.7 13.0 - 17.0 g/dL   HCT 56.9 60.9 - 47.9 %   MCV 87.7 78.0 - 100.0 fl   MCHC 34.2 30.0 - 36.0 g/dL   RDW 86.9 88.4 - 84.4 %  PSA   Collection Time: 08/16/24  9:10 AM  Result Value Ref Range   PSA 3.20 0.10 - 4.00 ng/mL    Assessment and Plan  There are no diagnoses linked to this encounter.  No follow-ups on file.   Greig Ring, MD

## 2024-11-15 NOTE — Assessment & Plan Note (Signed)
 Acute,  significant improvement in last 24 hours with removal of nail shard.  Recommended continued warm soaks, topical antibiotic and 5 to 7 days Keflex  500 mg 3 times daily for any remaining infection.  Return and ER precautions provided.

## 2024-11-26 ENCOUNTER — Other Ambulatory Visit
